# Patient Record
Sex: Female | Born: 1965 | Race: White | Hispanic: No | Marital: Married | Smoking: Never smoker
Health system: Southern US, Community
[De-identification: ages and names within clinical notes are randomized; demographics above are authoritative.]

## PROBLEM LIST (undated history)

## (undated) DIAGNOSIS — E119 Type 2 diabetes mellitus without complications: Secondary | ICD-10-CM

## (undated) DIAGNOSIS — I10 Essential (primary) hypertension: Secondary | ICD-10-CM

## (undated) DIAGNOSIS — I82412 Acute embolism and thrombosis of left femoral vein: Secondary | ICD-10-CM

## (undated) DIAGNOSIS — M8580 Other specified disorders of bone density and structure, unspecified site: Secondary | ICD-10-CM

## (undated) DIAGNOSIS — K769 Liver disease, unspecified: Secondary | ICD-10-CM

## (undated) HISTORY — DX: Liver disease, unspecified: K76.9

## (undated) HISTORY — DX: Acute embolism and thrombosis of left femoral vein: I82.412

## (undated) HISTORY — DX: Essential (primary) hypertension: I10

## (undated) HISTORY — DX: Other specified disorders of bone density and structure, unspecified site: M85.80

---

## 2002-12-04 DIAGNOSIS — I82412 Acute embolism and thrombosis of left femoral vein: Secondary | ICD-10-CM

## 2002-12-04 HISTORY — DX: Acute embolism and thrombosis of left femoral vein: I82.412

## 2002-12-04 HISTORY — PX: FOOT SURGERY: SHX648

## 2003-12-05 HISTORY — PX: PUBOVAGINAL SLING: SHX1035

## 2005-03-30 ENCOUNTER — Ambulatory Visit: Payer: Self-pay | Admitting: Obstetrics and Gynecology

## 2006-04-02 ENCOUNTER — Ambulatory Visit: Payer: Self-pay | Admitting: Obstetrics and Gynecology

## 2006-04-06 ENCOUNTER — Ambulatory Visit: Payer: Self-pay | Admitting: Obstetrics and Gynecology

## 2007-04-04 ENCOUNTER — Ambulatory Visit: Payer: Self-pay | Admitting: Obstetrics and Gynecology

## 2007-07-30 ENCOUNTER — Ambulatory Visit: Payer: Self-pay | Admitting: Internal Medicine

## 2007-08-17 ENCOUNTER — Ambulatory Visit: Payer: Self-pay | Admitting: Internal Medicine

## 2008-04-09 ENCOUNTER — Ambulatory Visit: Payer: Self-pay | Admitting: Obstetrics and Gynecology

## 2008-12-04 DIAGNOSIS — K769 Liver disease, unspecified: Secondary | ICD-10-CM

## 2008-12-04 HISTORY — DX: Liver disease, unspecified: K76.9

## 2008-12-04 HISTORY — PX: COLONOSCOPY: SHX174

## 2009-04-12 ENCOUNTER — Ambulatory Visit: Payer: Self-pay | Admitting: Unknown Physician Specialty

## 2010-04-13 ENCOUNTER — Ambulatory Visit: Payer: Self-pay | Admitting: Unknown Physician Specialty

## 2011-04-17 ENCOUNTER — Ambulatory Visit: Payer: Self-pay | Admitting: Internal Medicine

## 2012-02-20 ENCOUNTER — Other Ambulatory Visit: Payer: Self-pay | Admitting: *Deleted

## 2012-02-20 MED ORDER — LOSARTAN POTASSIUM 50 MG PO TABS: 50.0000 mg | ORAL_TABLET | Freq: Every day | ORAL | Status: DC

## 2012-02-20 MED ORDER — METOPROLOL SUCCINATE ER 100 MG PO TB24: 100.0000 mg | ORAL_TABLET | Freq: Every day | ORAL | Status: DC

## 2012-02-22 MED ORDER — LOSARTAN POTASSIUM 50 MG PO TABS: 50.0000 mg | ORAL_TABLET | Freq: Every day | ORAL | Status: DC

## 2012-02-22 MED ORDER — METOPROLOL SUCCINATE ER 100 MG PO TB24: 100.0000 mg | ORAL_TABLET | Freq: Every day | ORAL | Status: DC

## 2012-02-22 NOTE — Progress Notes (Signed)
Addended by: Cristy Hilts F on: 02/22/2012 02:15 PM   Modules accepted: Orders

## 2012-03-05 ENCOUNTER — Other Ambulatory Visit: Payer: Self-pay | Admitting: *Deleted

## 2012-03-05 MED ORDER — LOSARTAN POTASSIUM 50 MG PO TABS: 50.0000 mg | ORAL_TABLET | Freq: Every day | ORAL | Status: DC

## 2012-03-05 MED ORDER — METOPROLOL SUCCINATE ER 100 MG PO TB24: 100.0000 mg | ORAL_TABLET | Freq: Every day | ORAL | Status: DC

## 2012-04-04 ENCOUNTER — Encounter: Payer: Self-pay | Admitting: Internal Medicine

## 2012-04-17 ENCOUNTER — Ambulatory Visit (INDEPENDENT_AMBULATORY_CARE_PROVIDER_SITE_OTHER): Payer: BC Managed Care – PPO | Admitting: Internal Medicine

## 2012-04-17 ENCOUNTER — Ambulatory Visit: Payer: Self-pay

## 2012-04-17 ENCOUNTER — Encounter: Payer: Self-pay | Admitting: Internal Medicine

## 2012-04-17 ENCOUNTER — Telehealth: Payer: Self-pay | Admitting: *Deleted

## 2012-04-17 VITALS — BP 145/81 | HR 54 | Temp 98.1°F | Resp 16 | Ht 63.75 in | Wt 196.0 lb

## 2012-04-17 DIAGNOSIS — B379 Candidiasis, unspecified: Secondary | ICD-10-CM | POA: Insufficient documentation

## 2012-04-17 DIAGNOSIS — E875 Hyperkalemia: Secondary | ICD-10-CM

## 2012-04-17 DIAGNOSIS — R945 Abnormal results of liver function studies: Secondary | ICD-10-CM

## 2012-04-17 DIAGNOSIS — Z0000 Encounter for general adult medical examination without abnormal findings: Secondary | ICD-10-CM | POA: Insufficient documentation

## 2012-04-17 DIAGNOSIS — E669 Obesity, unspecified: Secondary | ICD-10-CM | POA: Insufficient documentation

## 2012-04-17 DIAGNOSIS — I10 Essential (primary) hypertension: Secondary | ICD-10-CM | POA: Insufficient documentation

## 2012-04-17 DIAGNOSIS — E663 Overweight: Secondary | ICD-10-CM | POA: Insufficient documentation

## 2012-04-17 LAB — CBC WITH DIFFERENTIAL/PLATELET
Basophils Relative: 0.7 % (ref 0.0–3.0)
Eosinophils Relative: 1.1 % (ref 0.0–5.0)
HCT: 42 % (ref 36.0–46.0)
Hemoglobin: 14.2 g/dL (ref 12.0–15.0)
Lymphs Abs: 3.1 10*3/uL (ref 0.7–4.0)
MCV: 92.8 fl (ref 78.0–100.0)
Monocytes Absolute: 0.5 10*3/uL (ref 0.1–1.0)
Monocytes Relative: 7.1 % (ref 3.0–12.0)
Neutro Abs: 3.5 10*3/uL (ref 1.4–7.7)
WBC: 7.3 10*3/uL (ref 4.5–10.5)

## 2012-04-17 LAB — COMPREHENSIVE METABOLIC PANEL
ALT: 46 U/L — ABNORMAL HIGH (ref 0–35)
BUN: 11 mg/dL (ref 6–23)
CO2: 27 mEq/L (ref 19–32)
Calcium: 9.5 mg/dL (ref 8.4–10.5)
Chloride: 105 mEq/L (ref 96–112)
Creatinine, Ser: 0.6 mg/dL (ref 0.4–1.2)
GFR: 107.5 mL/min (ref >60.00)

## 2012-04-17 LAB — LIPID PANEL
HDL: 51 mg/dL (ref >39.00)
Triglycerides: 110 mg/dL (ref 0.0–149.0)

## 2012-04-17 MED ORDER — NYSTATIN-TRIAMCINOLONE 100000-0.1 UNIT/GM-% EX OINT: TOPICAL_OINTMENT | Freq: Two times a day (BID) | CUTANEOUS | Status: AC

## 2012-04-17 MED ORDER — METOPROLOL SUCCINATE ER 100 MG PO TB24: 100.0000 mg | ORAL_TABLET | Freq: Every day | ORAL | Status: DC

## 2012-04-17 MED ORDER — LOSARTAN POTASSIUM 50 MG PO TABS: 50.0000 mg | ORAL_TABLET | Freq: Every day | ORAL | Status: DC

## 2012-04-17 NOTE — Telephone Encounter (Signed)
Message copied by Regis Bill on Wed Apr 17, 2012  4:43 PM ------      Message from: Ronna Polio A      Created: Wed Apr 17, 2012  2:38 PM       Labs show that potassium is slightly high. I would like to repeat BMP tomorrow. Labs also show that LFTs are slightly high. We should repeat this in 1 month.

## 2012-04-17 NOTE — Telephone Encounter (Signed)
Lab orders entered in EMR; patient informed/SLS

## 2012-04-17 NOTE — Patient Instructions (Signed)
[email or web address]

## 2012-04-17 NOTE — Assessment & Plan Note (Signed)
BP slightly elevated today and has been per pt at home. Will check renal function with labs. Will continue current meds and recheck in 1 month. Consider increase in Losartan 100mg  if BP consistently elevated.

## 2012-04-17 NOTE — Assessment & Plan Note (Signed)
BMI 33. Discussed caloric restriction and calorie counting with a food diary. Discussed increasing physical activity with a goal of 30-60 minutes most days of the week. Recommended that she refrain from use of the pollen given the potential risk of liver toxicity. We discussed potentially using appetite suppressants in the future when available (Belviq).

## 2012-04-17 NOTE — Progress Notes (Signed)
Subjective:    Patient ID: Kathryn Massey, female    DOB: 29-Jun-1966, 46 y.o.   MRN: [id number]  HPI 46 year old female with history of hypertension presents for followup. She reports that she is generally been feeling well. She reports full compliance with her medication. She denies any headache, chest pain, palpitations.  She is concerned today about her weight. She reports that she is trying to limit caloric intake and increase her physical activity. She questions whether she might benefit from a supplement such as B. pollen.  Outpatient Encounter Prescriptions as of 04/17/2012  Medication Sig Dispense Refill  . losartan (COZAAR) 50 MG tablet Take 1 tablet (50 mg total) by mouth daily.  90 tablet  3  . metoprolol succinate (TOPROL-XL) 100 MG 24 hr tablet Take 1 tablet (100 mg total) by mouth daily. Take with or immediately following a meal.  90 tablet  3  . DISCONTD: losartan (COZAAR) 50 MG tablet Take 1 tablet (50 mg total) by mouth daily.  90 tablet  0  . DISCONTD: metoprolol succinate (TOPROL-XL) 100 MG 24 hr tablet Take 1 tablet (100 mg total) by mouth daily. Take with or immediately following a meal.  90 tablet  0  . nystatin-triamcinolone ointment (MYCOLOG) Apply topically 2 (two) times daily.  60 g  3    Review of Systems  Constitutional: Negative for fever, chills, appetite change, fatigue and unexpected weight change.  HENT: Negative for ear pain, congestion, sore throat, trouble swallowing, neck pain, voice change and sinus pressure.   Eyes: Negative for visual disturbance.  Respiratory: Negative for cough, shortness of breath, wheezing and stridor.   Cardiovascular: Negative for chest pain, palpitations and leg swelling.  Gastrointestinal: Negative for nausea, vomiting, abdominal pain, diarrhea, constipation, blood in stool, abdominal distention and anal bleeding.  Genitourinary: Negative for dysuria and flank pain.  Musculoskeletal: Negative for myalgias, arthralgias and  gait problem.  Skin: Negative for color change and rash.  Neurological: Negative for dizziness and headaches.  Hematological: Negative for adenopathy. Does not bruise/bleed easily.  Psychiatric/Behavioral: Negative for suicidal ideas, sleep disturbance and dysphoric mood. The patient is not nervous/anxious.    BP 145/81  Pulse 54  Temp(Src) 98.1 F (36.7 C) (Oral)  Resp 16  Ht 5' 3.75" (1.619 m)  Wt 196 lb (88.905 kg)  BMI 33.91 kg/m2  SpO2 99%     Objective:   Physical Exam  Constitutional: She is oriented to person, place, and time. She appears well-developed and well-nourished. No distress.  HENT:  Head: Normocephalic and atraumatic.  Right Ear: External ear normal.  Left Ear: External ear normal.  Nose: Nose normal.  Mouth/Throat: Oropharynx is clear and moist. No oropharyngeal exudate.  Eyes: Conjunctivae are normal. Pupils are equal, round, and reactive to light. Right eye exhibits no discharge. Left eye exhibits no discharge. No scleral icterus.  Neck: Normal range of motion. Neck supple. No tracheal deviation present. No thyromegaly present.  Cardiovascular: Normal rate, regular rhythm, normal heart sounds and intact distal pulses.  Exam reveals no gallop and no friction rub.   No murmur heard. Pulmonary/Chest: Effort normal and breath sounds normal. No respiratory distress. She has no wheezes. She has no rales. She exhibits no tenderness.  Abdominal: Soft. Bowel sounds are normal. She exhibits no distension and no mass. There is no tenderness. There is no guarding.  Musculoskeletal: Normal range of motion. She exhibits no edema and no tenderness.  Lymphadenopathy:    She has no cervical adenopathy.  Neurological:  She is alert and oriented to person, place, and time. No cranial nerve deficit. She exhibits normal muscle tone. Coordination normal.  Skin: Skin is warm and dry. No rash noted. She is not diaphoretic. No erythema. No pallor.  Psychiatric: She has a normal mood  and affect. Her behavior is normal. Judgment and thought content normal.          Assessment & Plan:

## 2012-04-18 ENCOUNTER — Other Ambulatory Visit (INDEPENDENT_AMBULATORY_CARE_PROVIDER_SITE_OTHER): Payer: BC Managed Care – PPO | Admitting: *Deleted

## 2012-04-18 DIAGNOSIS — I10 Essential (primary) hypertension: Secondary | ICD-10-CM

## 2012-04-19 LAB — BASIC METABOLIC PANEL
BUN: 12 mg/dL (ref 6–23)
CO2: 27 mEq/L (ref 19–32)
Chloride: 105 mEq/L (ref 96–112)
Glucose, Bld: 105 mg/dL — ABNORMAL HIGH (ref 70–99)
Potassium: 5 mEq/L (ref 3.5–5.1)

## 2012-05-10 ENCOUNTER — Encounter: Payer: Self-pay | Admitting: Internal Medicine

## 2012-05-21 ENCOUNTER — Ambulatory Visit (INDEPENDENT_AMBULATORY_CARE_PROVIDER_SITE_OTHER): Payer: BC Managed Care – PPO | Admitting: Internal Medicine

## 2012-05-21 ENCOUNTER — Encounter: Payer: Self-pay | Admitting: Internal Medicine

## 2012-05-21 VITALS — BP 142/86 | HR 64 | Temp 98.7°F | Ht 63.75 in | Wt 195.5 lb

## 2012-05-21 DIAGNOSIS — E669 Obesity, unspecified: Secondary | ICD-10-CM

## 2012-05-21 DIAGNOSIS — R7989 Other specified abnormal findings of blood chemistry: Secondary | ICD-10-CM

## 2012-05-21 DIAGNOSIS — I10 Essential (primary) hypertension: Secondary | ICD-10-CM

## 2012-05-21 NOTE — Assessment & Plan Note (Signed)
BP improved at home, slightly elevated today. Will continue current medications for now. She will check blood pressure one to 2 times per week and e-mail with readings. If blood pressure consistently greater than 140/90, will increase dose of losartan to 100 mg daily. Followup in 6 months.

## 2012-05-21 NOTE — Progress Notes (Signed)
  Subjective:    Patient ID: Kathryn Massey, female    DOB: 03/08/1966, 46 y.o.   MRN: [id number]  HPI 46 year old female with history of hypertension and obesity presents for followup. She reports she is doing well. She notes she is trying to increase her exercise and was out walking even this morning. She reports full compliance with her medications. She notes that her blood pressure is typically been near 130/60-70. She denies any headache, palpitations, chest pain.  Outpatient Encounter Prescriptions as of 05/21/2012  Medication Sig Dispense Refill  . losartan (COZAAR) 50 MG tablet Take 1 tablet (50 mg total) by mouth daily.  90 tablet  3  . metoprolol succinate (TOPROL-XL) 100 MG 24 hr tablet Take 1 tablet (100 mg total) by mouth daily. Take with or immediately following a meal.  90 tablet  3  . nystatin-triamcinolone ointment (MYCOLOG) Apply topically 2 (two) times daily.  60 g  3    Review of Systems  Constitutional: Negative for fever, chills, appetite change, fatigue and unexpected weight change.  HENT: Negative for ear pain, congestion, sore throat, trouble swallowing, neck pain, voice change and sinus pressure.   Eyes: Negative for visual disturbance.  Respiratory: Negative for cough, shortness of breath, wheezing and stridor.   Cardiovascular: Negative for chest pain, palpitations and leg swelling.  Gastrointestinal: Negative for nausea, vomiting, abdominal pain, diarrhea, constipation, blood in stool, abdominal distention and anal bleeding.  Genitourinary: Negative for dysuria and flank pain.  Musculoskeletal: Negative for myalgias, arthralgias and gait problem.  Skin: Negative for color change and rash.  Neurological: Negative for dizziness and headaches.  Hematological: Negative for adenopathy. Does not bruise/bleed easily.  Psychiatric/Behavioral: Negative for suicidal ideas, disturbed wake/sleep cycle and dysphoric mood. The patient is not nervous/anxious.    BP 142/86   Pulse 64  Temp 98.7 F (37.1 C) (Oral)  Ht 5' 3.75" (1.619 m)  Wt 195 lb 8 oz (88.678 kg)  BMI 33.82 kg/m2  SpO2 98%     Objective:   Physical Exam  Constitutional: She is oriented to person, place, and time. She appears well-developed and well-nourished. No distress.  HENT:  Head: Normocephalic and atraumatic.  Right Ear: External ear normal.  Left Ear: External ear normal.  Nose: Nose normal.  Mouth/Throat: Oropharynx is clear and moist. No oropharyngeal exudate.  Eyes: Conjunctivae are normal. Pupils are equal, round, and reactive to light. Right eye exhibits no discharge. Left eye exhibits no discharge. No scleral icterus.  Neck: Normal range of motion. Neck supple. No tracheal deviation present. No thyromegaly present.  Cardiovascular: Normal rate, regular rhythm, normal heart sounds and intact distal pulses.  Exam reveals no gallop and no friction rub.   No murmur heard. Pulmonary/Chest: Effort normal and breath sounds normal. No respiratory distress. She has no wheezes. She has no rales. She exhibits no tenderness.  Musculoskeletal: Normal range of motion. She exhibits no edema and no tenderness.  Lymphadenopathy:    She has no cervical adenopathy.  Neurological: She is alert and oriented to person, place, and time. No cranial nerve deficit. She exhibits normal muscle tone. Coordination normal.  Skin: Skin is warm and dry. No rash noted. She is not diaphoretic. No erythema. No pallor.  Psychiatric: She has a normal mood and affect. Her behavior is normal. Judgment and thought content normal.          Assessment & Plan:

## 2012-05-21 NOTE — Assessment & Plan Note (Signed)
Mild elevation of LFTs noted in May 2013. We'll plan to repeat within the next 30 days.

## 2012-05-21 NOTE — Assessment & Plan Note (Signed)
Encouraged pt to continue efforts at healthy diet and exercise. Follow up 6 months.

## 2012-05-21 NOTE — Patient Instructions (Signed)
Check blood pressure 1-2 times per week. Call or email if blood pressure >140/90 consistently.

## 2012-05-22 ENCOUNTER — Telehealth: Payer: Self-pay | Admitting: *Deleted

## 2012-05-22 NOTE — Telephone Encounter (Signed)
Patient notified and lab appt scheduled.  

## 2012-05-22 NOTE — Telephone Encounter (Signed)
Message copied by Jobie Quaker on Wed May 22, 2012 11:46 AM ------      Message from: Ronna Polio A      Created: Tue May 21, 2012  9:41 AM      Regarding: Repeat LFTs       This pt will need to have repeat CMP prior to her next visit. I forgot to tell her this today. Just to repeat LFTs

## 2012-06-11 ENCOUNTER — Other Ambulatory Visit: Payer: Self-pay | Admitting: *Deleted

## 2012-06-11 DIAGNOSIS — I10 Essential (primary) hypertension: Secondary | ICD-10-CM

## 2012-06-11 MED ORDER — LOSARTAN POTASSIUM 50 MG PO TABS: 50.0000 mg | ORAL_TABLET | Freq: Every day | ORAL | Status: DC

## 2012-06-11 MED ORDER — METOPROLOL SUCCINATE ER 100 MG PO TB24: 100.0000 mg | ORAL_TABLET | Freq: Every day | ORAL | Status: DC

## 2012-07-09 LAB — HM DIABETES EYE EXAM: HM Diabetic Eye Exam: NORMAL

## 2012-08-04 LAB — HM DIABETES EYE EXAM: HM Diabetic Eye Exam: NORMAL

## 2012-08-29 ENCOUNTER — Ambulatory Visit: Payer: Self-pay

## 2012-11-18 ENCOUNTER — Other Ambulatory Visit (INDEPENDENT_AMBULATORY_CARE_PROVIDER_SITE_OTHER): Payer: BC Managed Care – PPO

## 2012-11-18 DIAGNOSIS — E875 Hyperkalemia: Secondary | ICD-10-CM

## 2012-11-18 DIAGNOSIS — R945 Abnormal results of liver function studies: Secondary | ICD-10-CM

## 2012-11-18 LAB — BASIC METABOLIC PANEL
BUN: 15 mg/dL (ref 6–23)
CO2: 28 mEq/L (ref 19–32)
Calcium: 9.4 mg/dL (ref 8.4–10.5)
Chloride: 104 mEq/L (ref 96–112)
Creatinine, Ser: 0.6 mg/dL (ref 0.4–1.2)
Glucose, Bld: 157 mg/dL — ABNORMAL HIGH (ref 70–99)

## 2012-11-18 LAB — HEPATIC FUNCTION PANEL
ALT: 39 U/L — ABNORMAL HIGH (ref 0–35)
Albumin: 4.2 g/dL (ref 3.5–5.2)
Bilirubin, Direct: 0.1 mg/dL (ref 0.0–0.3)
Total Protein: 7.3 g/dL (ref 6.0–8.3)

## 2012-11-21 ENCOUNTER — Encounter: Payer: Self-pay | Admitting: Internal Medicine

## 2012-11-21 ENCOUNTER — Telehealth: Payer: Self-pay | Admitting: Internal Medicine

## 2012-11-21 ENCOUNTER — Ambulatory Visit (INDEPENDENT_AMBULATORY_CARE_PROVIDER_SITE_OTHER): Payer: BC Managed Care – PPO | Admitting: Internal Medicine

## 2012-11-21 VITALS — BP 132/82 | HR 65 | Temp 98.0°F | Resp 16 | Wt 192.2 lb

## 2012-11-21 DIAGNOSIS — I10 Essential (primary) hypertension: Secondary | ICD-10-CM

## 2012-11-21 DIAGNOSIS — R7309 Other abnormal glucose: Secondary | ICD-10-CM

## 2012-11-21 DIAGNOSIS — R739 Hyperglycemia, unspecified: Secondary | ICD-10-CM

## 2012-11-21 DIAGNOSIS — B373 Candidiasis of vulva and vagina: Secondary | ICD-10-CM

## 2012-11-21 DIAGNOSIS — E669 Obesity, unspecified: Secondary | ICD-10-CM

## 2012-11-21 DIAGNOSIS — Z1331 Encounter for screening for depression: Secondary | ICD-10-CM

## 2012-11-21 MED ORDER — ONETOUCH ULTRA SYSTEM W/DEVICE KIT: 1.0000 | PACK | Freq: Once | Status: AC

## 2012-11-21 MED ORDER — METFORMIN HCL 500 MG PO TABS: 500.0000 mg | ORAL_TABLET | Freq: Two times a day (BID) | ORAL | Status: DC

## 2012-11-21 MED ORDER — FLUCONAZOLE 150 MG PO TABS: 150.0000 mg | ORAL_TABLET | Freq: Every day | ORAL | Status: DC

## 2012-11-21 MED ORDER — GLUCOSE BLOOD VI STRP: ORAL_STRIP | Status: DC

## 2012-11-21 NOTE — Assessment & Plan Note (Signed)
Patient was noted to have fasting blood sugar of 157 on recent labs. Will send A1c with labs today. She has a strong family history of diabetes. We discussed that fasting blood sugar greater than 125 is consistent with diabetes. We discussed that will likely plan to start metformin after reviewing results of A1c reading today. Encouraged patient to follow a low glycemic index diet and increase physical activity with a goal of 40 minutes 3 times per week at a minimum.

## 2012-11-21 NOTE — Assessment & Plan Note (Signed)
BMI 33. Encouraged patient to follow a Mediterranean style diet, low in process sugars in saturated fat. Encouraged her to increase physical activity with goal of 40 minutes 3 days per week at a minimum. She is planning to join a local gym. Followup in 3 months or sooner as needed.

## 2012-11-21 NOTE — Telephone Encounter (Signed)
A1c elevated 8.1%

## 2012-11-21 NOTE — Assessment & Plan Note (Signed)
Symptoms are consistent with vaginal candidiasis. Will treat with fluconazole. Patient will call or return to clinic if symptoms are persistent.

## 2012-11-21 NOTE — Progress Notes (Signed)
Subjective:    Patient ID: Kathryn Massey, female    DOB: [birth year], 46 y.o.   MRN: [id number]  HPI 46 year old female with history of obesity, hypertension, elevated liver function test presents for followup. Prior to this visit, she had lab work performed which showed improvement of liver function tests. It also showed elevated blood sugar at 157. Patient reports she was fasting during exam. She is a strong family history of diabetes in her mother and siblings. She has never been diagnosed with diabetes.  In regards to hypertension, she reports full compliance with her medications. She denies any chest pain, palpitations, headache. In regards to her obesity, she reports she is planning to join a local gym over the next few weeks. She is trying to limit overall caloric intake.  Outpatient Encounter Prescriptions as of 11/21/2012  Medication Sig Dispense Refill  . losartan (COZAAR) 50 MG tablet Take 1 tablet (50 mg total) by mouth daily.  90 tablet  3  . metoprolol succinate (TOPROL-XL) 100 MG 24 hr tablet Take 1 tablet (100 mg total) by mouth daily. Take with or immediately following a meal.  90 tablet  3  . nystatin-triamcinolone ointment (MYCOLOG) Apply topically 2 (two) times daily.  60 g  3  . fluconazole (DIFLUCAN) 150 MG tablet Take 1 tablet (150 mg total) by mouth daily.  3 tablet  0   BP 132/82  Pulse 65  Temp 98 F (36.7 C) (Oral)  Resp 16  Wt 192 lb 4 oz (87.204 kg)  SpO2 98%  Review of Systems  Constitutional: Negative for fever, chills, appetite change, fatigue and unexpected weight change.  HENT: Negative for ear pain, congestion, sore throat, trouble swallowing, neck pain, voice change and sinus pressure.   Eyes: Negative for visual disturbance.  Respiratory: Negative for cough, shortness of breath, wheezing and stridor.   Cardiovascular: Negative for chest pain, palpitations and leg swelling.  Gastrointestinal: Negative for nausea, vomiting, abdominal pain, diarrhea,  constipation, blood in stool, abdominal distention and anal bleeding.  Genitourinary: Negative for dysuria and flank pain.  Musculoskeletal: Negative for myalgias, arthralgias and gait problem.  Skin: Negative for color change and rash.  Neurological: Negative for dizziness and headaches.  Hematological: Negative for adenopathy. Does not bruise/bleed easily.  Psychiatric/Behavioral: Negative for suicidal ideas, sleep disturbance and dysphoric mood. The patient is not nervous/anxious.        Objective:   Physical Exam  Constitutional: She is oriented to person, place, and time. She appears well-developed and well-nourished. No distress.  HENT:  Head: Normocephalic and atraumatic.  Right Ear: External ear normal.  Left Ear: External ear normal.  Nose: Nose normal.  Mouth/Throat: Oropharynx is clear and moist. No oropharyngeal exudate.  Eyes: Conjunctivae normal are normal. Pupils are equal, round, and reactive to light. Right eye exhibits no discharge. Left eye exhibits no discharge. No scleral icterus.  Neck: Normal range of motion. Neck supple. No tracheal deviation present. No thyromegaly present.  Cardiovascular: Normal rate, regular rhythm, normal heart sounds and intact distal pulses.  Exam reveals no gallop and no friction rub.   No murmur heard. Pulmonary/Chest: Effort normal and breath sounds normal. No respiratory distress. She has no wheezes. She has no rales. She exhibits no tenderness.  Musculoskeletal: Normal range of motion. She exhibits no edema and no tenderness.  Lymphadenopathy:    She has no cervical adenopathy.  Neurological: She is alert and oriented to person, place, and time. No cranial nerve deficit. She exhibits normal muscle  tone. Coordination normal.  Skin: Skin is warm and dry. No rash noted. She is not diaphoretic. No erythema. No pallor.  Psychiatric: She has a normal mood and affect. Her behavior is normal. Judgment and thought content normal.           Assessment & Plan:

## 2012-11-21 NOTE — Assessment & Plan Note (Signed)
Blood pressure is well-controlled on current medications. Will continue. Recent renal function checked earlier this week was normal. Followup in 3 months.

## 2012-11-22 ENCOUNTER — Encounter: Payer: Self-pay | Admitting: Internal Medicine

## 2012-11-25 ENCOUNTER — Encounter: Payer: Self-pay | Admitting: Internal Medicine

## 2012-12-13 ENCOUNTER — Encounter: Payer: Self-pay | Admitting: Internal Medicine

## 2012-12-13 ENCOUNTER — Ambulatory Visit (INDEPENDENT_AMBULATORY_CARE_PROVIDER_SITE_OTHER): Payer: BC Managed Care – PPO | Admitting: Internal Medicine

## 2012-12-13 VITALS — BP 110/74 | HR 72 | Temp 98.2°F | Ht 63.75 in | Wt 187.5 lb

## 2012-12-13 DIAGNOSIS — J0100 Acute maxillary sinusitis, unspecified: Secondary | ICD-10-CM

## 2012-12-13 DIAGNOSIS — R739 Hyperglycemia, unspecified: Secondary | ICD-10-CM

## 2012-12-13 DIAGNOSIS — E669 Obesity, unspecified: Secondary | ICD-10-CM

## 2012-12-13 DIAGNOSIS — R7309 Other abnormal glucose: Secondary | ICD-10-CM

## 2012-12-13 DIAGNOSIS — E119 Type 2 diabetes mellitus without complications: Secondary | ICD-10-CM | POA: Insufficient documentation

## 2012-12-13 MED ORDER — AMOXICILLIN-POT CLAVULANATE 875-125 MG PO TABS: 1.0000 | ORAL_TABLET | Freq: Two times a day (BID) | ORAL | Status: DC

## 2012-12-13 NOTE — Progress Notes (Signed)
Subjective:    Patient ID: Kathryn Massey, female    DOB: 08/11/1966, 47 y.o.   MRN: [id number]  HPI 47 year old female with recent diagnosis of diabetes and history of hypertension presents for acute visit complaining of one-week history of sinus congestion, purulent nasal drainage, bilateral facial pain and eye pain. She has been taking over-the-counter medications including Mucinex antihistamines with no improvement. She denies fever or chills. She has had occasional cough. She reports mild ear pain. She denies shortness of breath.  In regards to recent diagnosis of diabetes, she brings record of blood sugars which show most sugars between 100-150. She has made huge changes in her diet, limiting process sugars and has also been increasing her physical activity. She has lost 5 pounds.  Outpatient Encounter Prescriptions as of 12/13/2012  Medication Sig Dispense Refill  . Blood Glucose Monitoring Suppl (ONE TOUCH ULTRA SYSTEM KIT) W/DEVICE KIT 1 kit by Does not apply route once.  1 each  0  . glucose blood (ONE TOUCH ULTRA TEST) test strip Use as instructed  100 each  12  . losartan (COZAAR) 50 MG tablet Take 1 tablet (50 mg total) by mouth daily.  90 tablet  3  . metFORMIN (GLUCOPHAGE) 500 MG tablet Take 1 tablet (500 mg total) by mouth 2 (two) times daily with a meal.  180 tablet  3  . metoprolol succinate (TOPROL-XL) 100 MG 24 hr tablet Take 1 tablet (100 mg total) by mouth daily. Take with or immediately following a meal.  90 tablet  3  . nystatin-triamcinolone ointment (MYCOLOG) Apply topically 2 (two) times daily.  60 g  3   BP 110/74  Pulse 72  Temp 98.2 F (36.8 C) (Oral)  Ht 5' 3.75" (1.619 m)  Wt 187 lb 8 oz (85.049 kg)  BMI 32.44 kg/m2  SpO2 98%  Review of Systems  Constitutional: Positive for fatigue. Negative for fever, chills, appetite change and unexpected weight change.  HENT: Positive for congestion, rhinorrhea, voice change, postnasal drip and sinus pressure. Negative  for ear pain, sore throat, trouble swallowing and neck pain.   Eyes: Positive for pain. Negative for visual disturbance.  Respiratory: Positive for cough. Negative for shortness of breath, wheezing and stridor.   Cardiovascular: Negative for chest pain, palpitations and leg swelling.  Gastrointestinal: Negative for nausea, vomiting, abdominal pain, diarrhea, constipation, blood in stool, abdominal distention and anal bleeding.  Genitourinary: Negative for dysuria and flank pain.  Musculoskeletal: Negative for myalgias, arthralgias and gait problem.  Skin: Negative for color change and rash.  Neurological: Negative for dizziness and headaches.  Hematological: Negative for adenopathy. Does not bruise/bleed easily.  Psychiatric/Behavioral: Negative for suicidal ideas, sleep disturbance and dysphoric mood. The patient is not nervous/anxious.        Objective:   Physical Exam  Constitutional: She is oriented to person, place, and time. She appears well-developed and well-nourished. No distress.  HENT:  Head: Normocephalic and atraumatic.  Right Ear: External ear normal. A middle ear effusion is present.  Left Ear: External ear normal. A middle ear effusion is present.  Nose: Mucosal edema present. Right sinus exhibits maxillary sinus tenderness. Left sinus exhibits maxillary sinus tenderness.  Mouth/Throat: Oropharynx is clear and moist. No oropharyngeal exudate.  Eyes: Conjunctivae normal are normal. Pupils are equal, round, and reactive to light. Right eye exhibits no discharge. Left eye exhibits no discharge. No scleral icterus.  Neck: Normal range of motion. Neck supple. No tracheal deviation present. No thyromegaly present.  Cardiovascular:  Normal rate, regular rhythm, normal heart sounds and intact distal pulses.  Exam reveals no gallop and no friction rub.   No murmur heard. Pulmonary/Chest: Effort normal and breath sounds normal. No respiratory distress. She has no wheezes. She has no  rales. She exhibits no tenderness.  Musculoskeletal: Normal range of motion. She exhibits no edema and no tenderness.  Lymphadenopathy:    She has no cervical adenopathy.  Neurological: She is alert and oriented to person, place, and time. No cranial nerve deficit. She exhibits normal muscle tone. Coordination normal.  Skin: Skin is warm and dry. No rash noted. She is not diaphoretic. No erythema. No pallor.  Psychiatric: She has a normal mood and affect. Her behavior is normal. Judgment and thought content normal.          Assessment & Plan:

## 2012-12-13 NOTE — Assessment & Plan Note (Signed)
Symptoms and exam consistent with bilateral acute maxillary sinusitis. Given persistence of symptoms >1 week, and lack of improvement with Mucinex, antihistamines, will treat with Augmentin. Pt will call if symptoms not improving.

## 2012-12-13 NOTE — Assessment & Plan Note (Signed)
Congratulated pt on weight loss of 5lb since last visit. Encouraged pt to continue efforts at healthy diet and regular physical activity, with goal of 3x per week.

## 2012-12-13 NOTE — Assessment & Plan Note (Signed)
BG well controlled based on pt report. Will continue metformin. Repeat A1c in 3.2014.  Encouraged pt to continue effort at healthy diet and regular physical activity.

## 2012-12-23 ENCOUNTER — Ambulatory Visit: Payer: BC Managed Care – PPO | Admitting: Internal Medicine

## 2013-02-17 ENCOUNTER — Other Ambulatory Visit (INDEPENDENT_AMBULATORY_CARE_PROVIDER_SITE_OTHER): Payer: BC Managed Care – PPO

## 2013-02-17 LAB — COMPREHENSIVE METABOLIC PANEL
ALT: 40 U/L — ABNORMAL HIGH (ref 0–35)
AST: 30 U/L (ref 0–37)
BUN: 11 mg/dL (ref 6–23)
Calcium: 9 mg/dL (ref 8.4–10.5)
Chloride: 105 mEq/L (ref 96–112)
Creatinine, Ser: 0.6 mg/dL (ref 0.4–1.2)
GFR: 105.19 mL/min (ref >60.00)
Total Bilirubin: 0.5 mg/dL (ref 0.3–1.2)

## 2013-02-24 ENCOUNTER — Ambulatory Visit (INDEPENDENT_AMBULATORY_CARE_PROVIDER_SITE_OTHER): Payer: BC Managed Care – PPO | Admitting: Internal Medicine

## 2013-02-24 ENCOUNTER — Encounter: Payer: Self-pay | Admitting: Internal Medicine

## 2013-02-24 VITALS — BP 152/88 | HR 64 | Temp 98.3°F | Wt 179.0 lb

## 2013-02-24 DIAGNOSIS — I10 Essential (primary) hypertension: Secondary | ICD-10-CM

## 2013-02-24 DIAGNOSIS — E119 Type 2 diabetes mellitus without complications: Secondary | ICD-10-CM

## 2013-02-24 DIAGNOSIS — E669 Obesity, unspecified: Secondary | ICD-10-CM

## 2013-02-24 NOTE — Assessment & Plan Note (Signed)
Congratulated pt on marked improvement in A1c from 8% to 6.4%. Encouraged her to continue efforts at healthy diet and regular physical activity. Follow up with repeat A1c in 3 months.

## 2013-02-24 NOTE — Assessment & Plan Note (Signed)
BP Readings from Last 3 Encounters:  02/24/13 152/88  12/13/12 110/74  11/21/12 132/82   BP slightly increased today, but has been well controlled at home. Will continue to monitor for now. Pt will call if consistently >140/90.

## 2013-02-24 NOTE — Progress Notes (Signed)
Subjective:    Patient ID: Kathryn Massey, female    DOB: Oct 05, 1966, 47 y.o.   MRN: [id number]  HPI 47YO female presents to follow up DM, obesity and HTN.  DM and obesity - Doing well, has adopted diet low in processed carbohydrates and saturated fats. Limiting caloric intake. Trying to increase physical activity. Compliant with metformin. Most BG between 80-120.  HTN - BP well controlled at home. No headaches, chest pain, palpitations. Compliant with medications.  Outpatient Encounter Prescriptions as of 02/24/2013  Medication Sig Dispense Refill  . Blood Glucose Monitoring Suppl (ONE TOUCH ULTRA SYSTEM KIT) W/DEVICE KIT 1 kit by Does not apply route once.  1 each  0  . glucose blood (ONE TOUCH ULTRA TEST) test strip Use as instructed  100 each  12  . losartan (COZAAR) 50 MG tablet Take 1 tablet (50 mg total) by mouth daily.  90 tablet  3  . metFORMIN (GLUCOPHAGE) 500 MG tablet Take 1 tablet (500 mg total) by mouth 2 (two) times daily with a meal.  180 tablet  3  . metoprolol succinate (TOPROL-XL) 100 MG 24 hr tablet Take 1 tablet (100 mg total) by mouth daily. Take with or immediately following a meal.  90 tablet  3  . nystatin-triamcinolone ointment (MYCOLOG) Apply topically 2 (two) times daily.  60 g  3  . [DISCONTINUED] amoxicillin-clavulanate (AUGMENTIN) 875-125 MG per tablet Take 1 tablet by mouth 2 (two) times daily.  20 tablet  0   No facility-administered encounter medications on file as of 02/24/2013.   BP 152/88  Pulse 64  Temp(Src) 98.3 F (36.8 C) (Oral)  Wt 179 lb (81.194 kg)  BMI 30.98 kg/m2  SpO2 96%  Review of Systems  Constitutional: Negative for fever, chills, appetite change, fatigue and unexpected weight change.  HENT: Negative for ear pain, congestion, sore throat, trouble swallowing, neck pain, voice change and sinus pressure.   Eyes: Negative for visual disturbance.  Respiratory: Negative for cough, shortness of breath, wheezing and stridor.    Cardiovascular: Negative for chest pain, palpitations and leg swelling.  Gastrointestinal: Negative for nausea, vomiting, abdominal pain, diarrhea, constipation, blood in stool, abdominal distention and anal bleeding.  Genitourinary: Negative for dysuria and flank pain.  Musculoskeletal: Negative for myalgias, arthralgias and gait problem.  Skin: Negative for color change and rash.  Neurological: Negative for dizziness and headaches.  Hematological: Negative for adenopathy. Does not bruise/bleed easily.  Psychiatric/Behavioral: Negative for suicidal ideas, sleep disturbance and dysphoric mood. The patient is not nervous/anxious.        Objective:   Physical Exam  Constitutional: She is oriented to person, place, and time. She appears well-developed and well-nourished. No distress.  HENT:  Head: Normocephalic and atraumatic.  Right Ear: External ear normal.  Left Ear: External ear normal.  Nose: Nose normal.  Mouth/Throat: Oropharynx is clear and moist. No oropharyngeal exudate.  Eyes: Conjunctivae are normal. Pupils are equal, round, and reactive to light. Right eye exhibits no discharge. Left eye exhibits no discharge. No scleral icterus.  Neck: Normal range of motion. Neck supple. No tracheal deviation present. No thyromegaly present.  Cardiovascular: Normal rate, regular rhythm, normal heart sounds and intact distal pulses.  Exam reveals no gallop and no friction rub.   No murmur heard. Pulmonary/Chest: Effort normal and breath sounds normal. No respiratory distress. She has no wheezes. She has no rales. She exhibits no tenderness.  Musculoskeletal: Normal range of motion. She exhibits no edema and no tenderness.  Lymphadenopathy:  She has no cervical adenopathy.  Neurological: She is alert and oriented to person, place, and time. No cranial nerve deficit. She exhibits normal muscle tone. Coordination normal.  Skin: Skin is warm and dry. No rash noted. She is not diaphoretic. No  erythema. No pallor.  Psychiatric: She has a normal mood and affect. Her behavior is normal. Judgment and thought content normal.          Assessment & Plan:

## 2013-02-24 NOTE — Assessment & Plan Note (Signed)
Wt Readings from Last 3 Encounters:  02/24/13 179 lb (81.194 kg)  12/13/12 187 lb 8 oz (85.049 kg)  11/21/12 192 lb 4 oz (87.204 kg)   Congratulated pt on over 10lb weight loss. Encouraged her to continue efforts at healthy diet and regular physical activity.

## 2013-05-05 ENCOUNTER — Ambulatory Visit: Payer: Self-pay

## 2013-05-23 ENCOUNTER — Other Ambulatory Visit: Payer: BC Managed Care – PPO

## 2013-05-27 ENCOUNTER — Ambulatory Visit: Payer: BC Managed Care – PPO | Admitting: Internal Medicine

## 2013-06-02 ENCOUNTER — Other Ambulatory Visit (INDEPENDENT_AMBULATORY_CARE_PROVIDER_SITE_OTHER): Payer: BC Managed Care – PPO

## 2013-06-02 DIAGNOSIS — E119 Type 2 diabetes mellitus without complications: Secondary | ICD-10-CM

## 2013-06-02 LAB — COMPREHENSIVE METABOLIC PANEL
ALT: 31 U/L (ref 0–35)
Albumin: 4.3 g/dL (ref 3.5–5.2)
Alkaline Phosphatase: 60 U/L (ref 39–117)
CO2: 24 mEq/L (ref 19–32)
Potassium: 4.8 mEq/L (ref 3.5–5.1)
Sodium: 139 mEq/L (ref 135–145)
Total Bilirubin: 0.7 mg/dL (ref 0.3–1.2)
Total Protein: 7.3 g/dL (ref 6.0–8.3)

## 2013-06-02 LAB — HEMOGLOBIN A1C: Hgb A1c MFr Bld: 6.6 % — ABNORMAL HIGH (ref 4.6–6.5)

## 2013-06-04 ENCOUNTER — Encounter: Payer: Self-pay | Admitting: Internal Medicine

## 2013-07-04 ENCOUNTER — Encounter: Payer: Self-pay | Admitting: Internal Medicine

## 2013-07-04 ENCOUNTER — Ambulatory Visit (INDEPENDENT_AMBULATORY_CARE_PROVIDER_SITE_OTHER): Payer: BC Managed Care – PPO | Admitting: Internal Medicine

## 2013-07-04 VITALS — BP 144/90 | HR 55 | Temp 98.4°F | Wt 180.0 lb

## 2013-07-04 DIAGNOSIS — E669 Obesity, unspecified: Secondary | ICD-10-CM

## 2013-07-04 DIAGNOSIS — I10 Essential (primary) hypertension: Secondary | ICD-10-CM

## 2013-07-04 DIAGNOSIS — E119 Type 2 diabetes mellitus without complications: Secondary | ICD-10-CM

## 2013-07-04 DIAGNOSIS — Z23 Encounter for immunization: Secondary | ICD-10-CM

## 2013-07-04 LAB — MICROALBUMIN / CREATININE URINE RATIO
Creatinine,U: 69.5 mg/dL
Microalb Creat Ratio: 1.2 mg/g (ref 0.0–30.0)
Microalb, Ur: 0.8 mg/dL (ref 0.0–1.9)

## 2013-07-04 NOTE — Assessment & Plan Note (Signed)
BP Readings from Last 3 Encounters:  07/04/13 144/90  02/24/13 152/88  12/13/12 110/74   Blood pressure slightly elevated today. Suspect related to increased anxiety with ongoing family issues. Previously has been well-controlled. Will continue losartan and metoprolol. Plan recheck of blood pressure at followup in September 2014.

## 2013-07-04 NOTE — Assessment & Plan Note (Signed)
Wt Readings from Last 3 Encounters:  07/04/13 180 lb (81.647 kg)  02/24/13 179 lb (81.194 kg)  12/13/12 187 lb 8 oz (85.049 kg)   Weight stable. Encouraged healthy diet and regular physical activity with goal of weight loss.

## 2013-07-04 NOTE — Progress Notes (Signed)
Subjective:    Patient ID: Kathryn Massey, female    DOB: 01/25/1966, 47 y.o.   MRN: [id number]  HPI 47 year old female with history of diabetes, hypertension, obesity presents for followup. She reports she is generally feeling well. Blood sugars have been well-controlled typically near 100 fasting. She notes some dietary indiscretion with recent summer travel. She is compliant with her medications. She notes recent increase in anxiety related to ongoing family issues. She attributes her elevated blood pressure today to this. She denies any headache, chest pain, palpitations. No other new concerns today.  Outpatient Encounter Prescriptions as of 07/04/2013  Medication Sig Dispense Refill  . Blood Glucose Monitoring Suppl (ONE TOUCH ULTRA SYSTEM KIT) W/DEVICE KIT 1 kit by Does not apply route once.  1 each  0  . glucose blood (ONE TOUCH ULTRA TEST) test strip Use as instructed  100 each  12  . losartan (COZAAR) 50 MG tablet Take 1 tablet (50 mg total) by mouth daily.  90 tablet  3  . metFORMIN (GLUCOPHAGE) 500 MG tablet Take 1 tablet (500 mg total) by mouth 2 (two) times daily with a meal.  180 tablet  3  . metoprolol succinate (TOPROL-XL) 100 MG 24 hr tablet Take 1 tablet (100 mg total) by mouth daily. Take with or immediately following a meal.  90 tablet  3   No facility-administered encounter medications on file as of 07/04/2013.   BP 144/90  Pulse 55  Temp(Src) 98.4 F (36.9 C) (Oral)  Wt 180 lb (81.647 kg)  BMI 31.15 kg/m2  SpO2 96%  Review of Systems  Constitutional: Negative for fever, chills, appetite change, fatigue and unexpected weight change.  HENT: Negative for ear pain, congestion, sore throat, trouble swallowing, neck pain, voice change and sinus pressure.   Eyes: Negative for visual disturbance.  Respiratory: Negative for cough, shortness of breath, wheezing and stridor.   Cardiovascular: Negative for chest pain, palpitations and leg swelling.  Gastrointestinal: Negative  for nausea, vomiting, abdominal pain, diarrhea, constipation, blood in stool, abdominal distention and anal bleeding.  Genitourinary: Negative for dysuria and flank pain.  Musculoskeletal: Negative for myalgias, arthralgias and gait problem.  Skin: Negative for color change and rash.  Neurological: Negative for dizziness and headaches.  Hematological: Negative for adenopathy. Does not bruise/bleed easily.  Psychiatric/Behavioral: Negative for suicidal ideas, sleep disturbance and dysphoric mood. The patient is nervous/anxious.        Objective:   Physical Exam  Constitutional: She is oriented to person, place, and time. She appears well-developed and well-nourished. No distress.  HENT:  Head: Normocephalic and atraumatic.  Right Ear: External ear normal.  Left Ear: External ear normal.  Nose: Nose normal.  Mouth/Throat: Oropharynx is clear and moist. No oropharyngeal exudate.  Eyes: Conjunctivae are normal. Pupils are equal, round, and reactive to light. Right eye exhibits no discharge. Left eye exhibits no discharge. No scleral icterus.  Neck: Normal range of motion. Neck supple. No tracheal deviation present. No thyromegaly present.  Cardiovascular: Normal rate, regular rhythm, normal heart sounds and intact distal pulses.  Exam reveals no gallop and no friction rub.   No murmur heard. Pulmonary/Chest: Effort normal and breath sounds normal. No accessory muscle usage. Not tachypneic. No respiratory distress. She has no decreased breath sounds. She has no wheezes. She has no rhonchi. She has no rales. She exhibits no tenderness.  Musculoskeletal: Normal range of motion. She exhibits no edema and no tenderness.  Lymphadenopathy:    She has no cervical adenopathy.  Neurological: She is alert and oriented to person, place, and time. No cranial nerve deficit. She exhibits normal muscle tone. Coordination normal.  Skin: Skin is warm and dry. No rash noted. She is not diaphoretic. No erythema.  No pallor.  Psychiatric: She has a normal mood and affect. Her behavior is normal. Judgment and thought content normal.          Assessment & Plan:

## 2013-07-04 NOTE — Assessment & Plan Note (Signed)
Blood sugars have been well-controlled. Will plan to repeat A1c in September 2014. Continue metformin. Encourage continued efforts at healthy diet. Foot exam normal today.

## 2013-07-08 ENCOUNTER — Encounter: Payer: Self-pay | Admitting: Internal Medicine

## 2013-07-08 ENCOUNTER — Other Ambulatory Visit: Payer: Self-pay | Admitting: Internal Medicine

## 2013-09-02 ENCOUNTER — Other Ambulatory Visit (INDEPENDENT_AMBULATORY_CARE_PROVIDER_SITE_OTHER): Payer: BC Managed Care – PPO

## 2013-09-02 DIAGNOSIS — E119 Type 2 diabetes mellitus without complications: Secondary | ICD-10-CM

## 2013-09-02 LAB — COMPREHENSIVE METABOLIC PANEL
AST: 22 U/L (ref 0–37)
Albumin: 4.2 g/dL (ref 3.5–5.2)
BUN: 12 mg/dL (ref 6–23)
CO2: 29 mEq/L (ref 19–32)
Calcium: 9.6 mg/dL (ref 8.4–10.5)
Chloride: 107 mEq/L (ref 96–112)
Creatinine, Ser: 0.7 mg/dL (ref 0.4–1.2)
GFR: 103.09 mL/min (ref >60.00)
Glucose, Bld: 119 mg/dL — ABNORMAL HIGH (ref 70–99)
Potassium: 5.1 mEq/L (ref 3.5–5.1)

## 2013-09-03 ENCOUNTER — Other Ambulatory Visit: Payer: BC Managed Care – PPO

## 2013-09-17 ENCOUNTER — Ambulatory Visit: Payer: BC Managed Care – PPO | Admitting: Internal Medicine

## 2013-09-23 ENCOUNTER — Other Ambulatory Visit: Payer: Self-pay | Admitting: Internal Medicine

## 2013-09-23 ENCOUNTER — Ambulatory Visit (INDEPENDENT_AMBULATORY_CARE_PROVIDER_SITE_OTHER): Payer: BC Managed Care – PPO | Admitting: Internal Medicine

## 2013-09-23 ENCOUNTER — Encounter: Payer: Self-pay | Admitting: Internal Medicine

## 2013-09-23 VITALS — BP 138/70 | HR 98 | Temp 98.4°F | Wt 184.0 lb

## 2013-09-23 DIAGNOSIS — E669 Obesity, unspecified: Secondary | ICD-10-CM

## 2013-09-23 DIAGNOSIS — E119 Type 2 diabetes mellitus without complications: Secondary | ICD-10-CM

## 2013-09-23 DIAGNOSIS — Z23 Encounter for immunization: Secondary | ICD-10-CM

## 2013-09-23 DIAGNOSIS — I10 Essential (primary) hypertension: Secondary | ICD-10-CM

## 2013-09-23 DIAGNOSIS — E1169 Type 2 diabetes mellitus with other specified complication: Secondary | ICD-10-CM

## 2013-09-23 MED ORDER — LOSARTAN POTASSIUM 50 MG PO TABS: 50.0000 mg | ORAL_TABLET | Freq: Every day | ORAL | Status: DC

## 2013-09-23 MED ORDER — METFORMIN HCL 500 MG PO TABS: 500.0000 mg | ORAL_TABLET | Freq: Two times a day (BID) | ORAL | Status: DC

## 2013-09-23 NOTE — Assessment & Plan Note (Signed)
Lab Results  Component Value Date   HGBA1C 6.6* 09/02/2013   Blood sugars have been well-controlled on metformin. Will continue.

## 2013-09-23 NOTE — Assessment & Plan Note (Signed)
BP Readings from Last 3 Encounters:  09/23/13 138/70  07/04/13 144/90  02/24/13 152/88   Blood pressure well-controlled with losartan and metoprolol. Will continue.

## 2013-09-23 NOTE — Telephone Encounter (Signed)
Eprescribed.

## 2013-09-23 NOTE — Assessment & Plan Note (Signed)
Wt Readings from Last 3 Encounters:  09/23/13 184 lb (83.462 kg)  07/04/13 180 lb (81.647 kg)  02/24/13 179 lb (81.194 kg)   Encouraged healthy diet and regular physical activity with goal of weight loss.

## 2013-09-23 NOTE — Progress Notes (Signed)
Subjective:    Patient ID: Kathryn Massey, female    DOB: 22-Jun-1966, 47 y.o.   MRN: [id number]  HPI 47 year old female with history of obesity, diabetes, hypertension presents for followup. She reports she is generally feeling well. Recent hemoglobin A1c was 6.6%. She has been compliant with metformin. No new concerns today.  Outpatient Prescriptions Prior to Visit  Medication Sig Dispense Refill  . Blood Glucose Monitoring Suppl (ONE TOUCH ULTRA SYSTEM KIT) W/DEVICE KIT 1 kit by Does not apply route once.  1 each  0  . glucose blood (ONE TOUCH ULTRA TEST) test strip Use as instructed  100 each  12  . losartan (COZAAR) 50 MG tablet Take 1 tablet (50 mg total) by mouth daily.  90 tablet  3  . metFORMIN (GLUCOPHAGE) 500 MG tablet Take 1 tablet (500 mg total) by mouth 2 (two) times daily with a meal.  180 tablet  3  . metoprolol succinate (TOPROL-XL) 100 MG 24 hr tablet Take 1 tablet (100 mg total) by mouth daily. Take with or immediately following a meal.  90 tablet  3   No facility-administered medications prior to visit.    No facility-administered encounter medications on file as of 09/23/2013.   BP 138/70  Pulse 98  Temp(Src) 98.4 F (36.9 C) (Oral)  Wt 184 lb (83.462 kg)  BMI 31.84 kg/m2  SpO2 98%  Review of Systems  Constitutional: Negative for fever, chills, appetite change, fatigue and unexpected weight change.  HENT: Negative for congestion, ear pain, sinus pressure, sore throat, trouble swallowing and voice change.   Eyes: Negative for visual disturbance.  Respiratory: Negative for cough, shortness of breath, wheezing and stridor.   Cardiovascular: Negative for chest pain, palpitations and leg swelling.  Gastrointestinal: Negative for nausea, vomiting, abdominal pain, diarrhea, constipation, blood in stool, abdominal distention and anal bleeding.  Genitourinary: Negative for dysuria and flank pain.  Musculoskeletal: Negative for arthralgias, gait problem, myalgias and  neck pain.  Skin: Negative for color change and rash.  Neurological: Negative for dizziness and headaches.  Hematological: Negative for adenopathy. Does not bruise/bleed easily.  Psychiatric/Behavioral: Negative for suicidal ideas, sleep disturbance and dysphoric mood. The patient is not nervous/anxious.        Objective:   Physical Exam  Constitutional: She is oriented to person, place, and time. She appears well-developed and well-nourished. No distress.  HENT:  Head: Normocephalic and atraumatic.  Right Ear: External ear normal.  Left Ear: External ear normal.  Nose: Nose normal.  Mouth/Throat: Oropharynx is clear and moist. No oropharyngeal exudate.  Eyes: Conjunctivae are normal. Pupils are equal, round, and reactive to light. Right eye exhibits no discharge. Left eye exhibits no discharge. No scleral icterus.  Neck: Normal range of motion. Neck supple. No tracheal deviation present. No thyromegaly present.  Cardiovascular: Normal rate, regular rhythm, normal heart sounds and intact distal pulses.  Exam reveals no gallop and no friction rub.   No murmur heard. Pulmonary/Chest: Effort normal and breath sounds normal. No accessory muscle usage. Not tachypneic. No respiratory distress. She has no decreased breath sounds. She has no wheezes. She has no rhonchi. She has no rales. She exhibits no tenderness.  Musculoskeletal: Normal range of motion. She exhibits no edema and no tenderness.  Lymphadenopathy:    She has no cervical adenopathy.  Neurological: She is alert and oriented to person, place, and time. No cranial nerve deficit. She exhibits normal muscle tone. Coordination normal.  Skin: Skin is warm and dry. No rash  noted. She is not diaphoretic. No erythema. No pallor.  Psychiatric: She has a normal mood and affect. Her behavior is normal. Judgment and thought content normal.          Assessment & Plan:

## 2013-10-15 ENCOUNTER — Encounter: Payer: Self-pay | Admitting: Internal Medicine

## 2013-12-25 ENCOUNTER — Other Ambulatory Visit (INDEPENDENT_AMBULATORY_CARE_PROVIDER_SITE_OTHER): Payer: BC Managed Care – PPO

## 2013-12-25 DIAGNOSIS — E119 Type 2 diabetes mellitus without complications: Secondary | ICD-10-CM

## 2013-12-25 LAB — COMPREHENSIVE METABOLIC PANEL
ALK PHOS: 59 U/L (ref 39–117)
ALT: 32 U/L (ref 0–35)
AST: 28 U/L (ref 0–37)
Albumin: 4.1 g/dL (ref 3.5–5.2)
BILIRUBIN TOTAL: 0.6 mg/dL (ref 0.3–1.2)
BUN: 14 mg/dL (ref 6–23)
CO2: 28 mEq/L (ref 19–32)
Calcium: 9.3 mg/dL (ref 8.4–10.5)
Chloride: 106 mEq/L (ref 96–112)
Creatinine, Ser: 0.6 mg/dL (ref 0.4–1.2)
GFR: 115.13 mL/min (ref >60.00)
Glucose, Bld: 111 mg/dL — ABNORMAL HIGH (ref 70–99)
POTASSIUM: 4.9 meq/L (ref 3.5–5.1)
Sodium: 141 mEq/L (ref 135–145)
TOTAL PROTEIN: 7.3 g/dL (ref 6.0–8.3)

## 2013-12-25 LAB — MICROALBUMIN / CREATININE URINE RATIO
Creatinine,U: 147.1 mg/dL
MICROALB UR: 0.6 mg/dL (ref 0.0–1.9)
Microalb Creat Ratio: 0.4 mg/g (ref 0.0–30.0)

## 2013-12-25 LAB — LIPID PANEL
CHOL/HDL RATIO: 4
CHOLESTEROL: 192 mg/dL (ref 0–200)
HDL: 49.3 mg/dL (ref >39.00)
LDL CALC: 118 mg/dL — AB (ref 0–99)
Triglycerides: 125 mg/dL (ref 0.0–149.0)
VLDL: 25 mg/dL (ref 0.0–40.0)

## 2013-12-25 LAB — HEMOGLOBIN A1C: Hgb A1c MFr Bld: 6.7 % — ABNORMAL HIGH (ref 4.6–6.5)

## 2013-12-26 ENCOUNTER — Ambulatory Visit: Payer: BC Managed Care – PPO | Admitting: Internal Medicine

## 2013-12-30 ENCOUNTER — Ambulatory Visit: Payer: BC Managed Care – PPO | Admitting: Internal Medicine

## 2014-01-30 ENCOUNTER — Ambulatory Visit: Payer: BC Managed Care – PPO | Admitting: Internal Medicine

## 2014-06-16 ENCOUNTER — Other Ambulatory Visit: Payer: Self-pay | Admitting: Internal Medicine

## 2014-06-24 LAB — HM PAP SMEAR: HM PAP: NORMAL

## 2014-06-26 ENCOUNTER — Ambulatory Visit: Payer: Self-pay

## 2014-06-26 LAB — HM MAMMOGRAPHY: HM Mammogram: NORMAL

## 2014-06-29 ENCOUNTER — Encounter: Payer: Self-pay | Admitting: *Deleted

## 2014-06-29 NOTE — Progress Notes (Signed)
Chart reviewed for DM bundle. No appt since 09/23/13. Sent mychart message on need for appointment and fasting lab appt.

## 2014-07-01 NOTE — Telephone Encounter (Signed)
Mailed unread message to pt  

## 2014-07-07 ENCOUNTER — Ambulatory Visit (INDEPENDENT_AMBULATORY_CARE_PROVIDER_SITE_OTHER): Payer: BC Managed Care – PPO | Admitting: Internal Medicine

## 2014-07-07 ENCOUNTER — Encounter: Payer: Self-pay | Admitting: Internal Medicine

## 2014-07-07 VITALS — BP 142/78 | HR 76 | Temp 98.3°F | Ht 63.75 in | Wt 192.2 lb

## 2014-07-07 DIAGNOSIS — E669 Obesity, unspecified: Secondary | ICD-10-CM

## 2014-07-07 DIAGNOSIS — M25562 Pain in left knee: Secondary | ICD-10-CM | POA: Insufficient documentation

## 2014-07-07 DIAGNOSIS — I10 Essential (primary) hypertension: Secondary | ICD-10-CM

## 2014-07-07 DIAGNOSIS — E119 Type 2 diabetes mellitus without complications: Secondary | ICD-10-CM

## 2014-07-07 DIAGNOSIS — E1169 Type 2 diabetes mellitus with other specified complication: Secondary | ICD-10-CM

## 2014-07-07 DIAGNOSIS — M25569 Pain in unspecified knee: Secondary | ICD-10-CM

## 2014-07-07 LAB — COMPREHENSIVE METABOLIC PANEL
ALK PHOS: 69 U/L (ref 39–117)
ALT: 42 U/L — AB (ref 0–35)
AST: 32 U/L (ref 0–37)
Albumin: 4.1 g/dL (ref 3.5–5.2)
BILIRUBIN TOTAL: 0.5 mg/dL (ref 0.2–1.2)
BUN: 13 mg/dL (ref 6–23)
CO2: 30 meq/L (ref 19–32)
Calcium: 9.9 mg/dL (ref 8.4–10.5)
Chloride: 101 mEq/L (ref 96–112)
Creatinine, Ser: 0.7 mg/dL (ref 0.4–1.2)
GFR: 89.85 mL/min (ref >60.00)
Glucose, Bld: 202 mg/dL — ABNORMAL HIGH (ref 70–99)
Potassium: 4.7 mEq/L (ref 3.5–5.1)
SODIUM: 139 meq/L (ref 135–145)
Total Protein: 6.9 g/dL (ref 6.0–8.3)

## 2014-07-07 LAB — LIPID PANEL
Cholesterol: 177 mg/dL (ref 0–200)
HDL: 44 mg/dL (ref >39.00)
NONHDL: 133
Total CHOL/HDL Ratio: 4
Triglycerides: 230 mg/dL — ABNORMAL HIGH (ref 0.0–149.0)
VLDL: 46 mg/dL — AB (ref 0.0–40.0)

## 2014-07-07 LAB — LDL CHOLESTEROL, DIRECT: Direct LDL: 119.9 mg/dL

## 2014-07-07 LAB — HM DIABETES FOOT EXAM: HM Diabetic Foot Exam: NORMAL

## 2014-07-07 LAB — MICROALBUMIN / CREATININE URINE RATIO
Creatinine,U: 101.9 mg/dL
Microalb Creat Ratio: 0.5 mg/g (ref 0.0–30.0)
Microalb, Ur: 0.5 mg/dL (ref 0.0–1.9)

## 2014-07-07 LAB — HEMOGLOBIN A1C: Hgb A1c MFr Bld: 7.2 % — ABNORMAL HIGH (ref 4.6–6.5)

## 2014-07-07 MED ORDER — GLUCOSE BLOOD VI STRP: ORAL_STRIP | Status: DC

## 2014-07-07 NOTE — Assessment & Plan Note (Signed)
Knee pain and instability concerning for medial meniscal tear. Will set up sports med evaluation.

## 2014-07-07 NOTE — Progress Notes (Signed)
Pre visit review using our clinic review tool, if applicable. No additional management support is needed unless otherwise documented below in the visit note. 

## 2014-07-07 NOTE — Assessment & Plan Note (Signed)
Lab Results  Component Value Date   HGBA1C 7.2* 07/07/2014   BG well controlled, however slightly higher than previous when trended. Encouraged healthy diet and weight loss. Will continue current medications.

## 2014-07-07 NOTE — Patient Instructions (Signed)
Labs today.   Follow up in 3 months.  

## 2014-07-07 NOTE — Assessment & Plan Note (Signed)
Wt Readings from Last 3 Encounters:  07/07/14 192 lb 4 oz (87.204 kg)  09/23/13 184 lb (83.462 kg)  07/04/13 180 lb (81.647 kg)   Body mass index is 33.27 kg/(m^2). Encouraged healthy diet and regular exercise with goal of weight loss.

## 2014-07-07 NOTE — Progress Notes (Signed)
Subjective:    Patient ID: Kathryn Massey, female    DOB: Apr 30, 1966, 48 y.o.   MRN: [id number]  HPI 48YO female presents for follow up.  DM - BG running near 120 in the morning and near 115 in the evening. Compliant with meds. [facility] [employer]  Pain in left knee anteriorly. Hurts worse with standing. No injury. Occasionally, gives way on her. Taking ibuprofen with minimal improvement. Also icing with minimal improvement.  Review of Systems  Constitutional: Negative for fever, chills, appetite change, fatigue and unexpected weight change.  Eyes: Negative for visual disturbance.  Respiratory: Negative for shortness of breath.   Cardiovascular: Negative for chest pain and leg swelling.  Gastrointestinal: Negative for abdominal pain.  Musculoskeletal: Positive for arthralgias and myalgias. Negative for back pain, gait problem and joint swelling.  Skin: Negative for color change and rash.  Neurological: Positive for weakness (intermittent left knee weakness). Negative for numbness.  Hematological: Negative for adenopathy. Does not bruise/bleed easily.  Psychiatric/Behavioral: Negative for dysphoric mood. The patient is not nervous/anxious.        Objective:    BP 142/78  Pulse 76  Temp(Src) 98.3 F (36.8 C) (Oral)  Ht 5' 3.75" (1.619 m)  Wt 192 lb 4 oz (87.204 kg)  BMI 33.27 kg/m2  SpO2 96% Physical Exam  Constitutional: She is oriented to person, place, and time. She appears well-developed and well-nourished. No distress.  HENT:  Head: Normocephalic and atraumatic.  Right Ear: External ear normal.  Left Ear: External ear normal.  Nose: Nose normal.  Mouth/Throat: Oropharynx is clear and moist. No oropharyngeal exudate.  Eyes: Conjunctivae are normal. Pupils are equal, round, and reactive to light. Right eye exhibits no discharge. Left eye exhibits no discharge. No scleral icterus.  Neck: Normal range of motion. Neck supple. No tracheal deviation present. No  thyromegaly present.  Cardiovascular: Normal rate, regular rhythm, normal heart sounds and intact distal pulses.  Exam reveals no gallop and no friction rub.   No murmur heard. Pulmonary/Chest: Effort normal and breath sounds normal. No accessory muscle usage. Not tachypneic. No respiratory distress. She has no decreased breath sounds. She has no wheezes. She has no rhonchi. She has no rales. She exhibits no tenderness.  Musculoskeletal: Normal range of motion. She exhibits no edema and no tenderness.       Left knee: She exhibits normal range of motion and no swelling. No tenderness found.  Lymphadenopathy:    She has no cervical adenopathy.  Neurological: She is alert and oriented to person, place, and time. No cranial nerve deficit. She exhibits normal muscle tone. Coordination normal.  Skin: Skin is warm and dry. No rash noted. She is not diaphoretic. No erythema. No pallor.  Psychiatric: She has a normal mood and affect. Her behavior is normal. Judgment and thought content normal.          Assessment & Plan:   Problem List Items Addressed This Visit     Unprioritized   Diabetes mellitus type 2, controlled - Primary      Lab Results  Component Value Date   HGBA1C 7.2* 07/07/2014   BG well controlled, however slightly higher than previous when trended. Encouraged healthy diet and weight loss. Will continue current medications.    Relevant Orders      Comprehensive metabolic panel (Completed)      Hemoglobin A1c (Completed)      Lipid panel (Completed)      Microalbumin / creatinine urine ratio (Completed)  Hypertension      BP Readings from Last 3 Encounters:  07/07/14 142/78  09/23/13 138/70  07/04/13 144/90   BP slightly elevated today, but has been better controlled at home per pt report. Will check renal function with labs. Pt will email with BP 1-2 times weekly. If no improvement, plan increase Losartan to 100mg  daily.    Left knee pain     Knee pain and  instability concerning for medial meniscal tear. Will set up sports med evaluation.    Relevant Orders      Ambulatory referral to Sports Medicine   Obesity (BMI 30-39.9)      Wt Readings from Last 3 Encounters:  07/07/14 192 lb 4 oz (87.204 kg)  09/23/13 184 lb (83.462 kg)  07/04/13 180 lb (81.647 kg)   Body mass index is 33.27 kg/(m^2). Encouraged healthy diet and regular exercise with goal of weight loss.     Other Visit Diagnoses   Diabetes mellitus type 2 in obese        Relevant Medications       glucose blood (ONE TOUCH ULTRA TEST) test strip        Return in about 3 months (around 10/07/2014) for Recheck of Diabetes.

## 2014-07-07 NOTE — Assessment & Plan Note (Signed)
BP Readings from Last 3 Encounters:  07/07/14 142/78  09/23/13 138/70  07/04/13 144/90   BP slightly elevated today, but has been better controlled at home per pt report. Will check renal function with labs. Pt will email with BP 1-2 times weekly. If no improvement, plan increase Losartan to 100mg  daily.

## 2014-07-13 ENCOUNTER — Ambulatory Visit (INDEPENDENT_AMBULATORY_CARE_PROVIDER_SITE_OTHER): Payer: BC Managed Care – PPO | Admitting: Family Medicine

## 2014-07-13 ENCOUNTER — Encounter: Payer: Self-pay | Admitting: Family Medicine

## 2014-07-13 ENCOUNTER — Other Ambulatory Visit (INDEPENDENT_AMBULATORY_CARE_PROVIDER_SITE_OTHER): Payer: BC Managed Care – PPO

## 2014-07-13 VITALS — BP 162/85 | HR 68 | Ht 63.0 in | Wt 191.0 lb

## 2014-07-13 DIAGNOSIS — M23204 Derangement of unspecified medial meniscus due to old tear or injury, left knee: Secondary | ICD-10-CM

## 2014-07-13 DIAGNOSIS — M23201 Derangement of unspecified lateral meniscus due to old tear or injury, left knee: Secondary | ICD-10-CM

## 2014-07-13 DIAGNOSIS — M23209 Derangement of unspecified meniscus due to old tear or injury, unspecified knee: Secondary | ICD-10-CM | POA: Insufficient documentation

## 2014-07-13 DIAGNOSIS — M25569 Pain in unspecified knee: Secondary | ICD-10-CM

## 2014-07-13 DIAGNOSIS — M23302 Other meniscus derangements, unspecified lateral meniscus, unspecified knee: Secondary | ICD-10-CM

## 2014-07-13 DIAGNOSIS — M23207 Derangement of unspecified meniscus due to old tear or injury, left knee: Secondary | ICD-10-CM

## 2014-07-13 DIAGNOSIS — M25562 Pain in left knee: Secondary | ICD-10-CM

## 2014-07-13 MED ORDER — DICLOFENAC SODIUM 2 % TD SOLN: TRANSDERMAL | Status: DC

## 2014-07-13 NOTE — Progress Notes (Signed)
  Tawana ScaleZach Smith D.O. [facility] [address] [place], [place] [address] Phone: [phone] Subjective:    I'm seeing this patient by the request  of:  Wynona DoveWALKER,JENNIFER AZBELL, MD   CC: Left knee pain  BJY:[id number]:Subjective Hassie BruceDeborah L Massey is a 48 y.o. female coming in with complaint of left knee pain. Patient states that it hurts worse when she is standing for long amount of time. Patient states after a long day and when she sits she actually has some difficulty with moving the knee. Patient states that over the course of time it seems to get better. Ibuprofen does help. Denies any radiation of the leg or any numbness. States that pain is approximately 7/10 when it occurs. States that she can sleep comfortably at night. Denies any radiation of the leg or any weakness. Denies any true injury to this knee.     Past medical history, social, surgical and family history all reviewed in electronic medical record.   Review of Systems: No headache, visual changes, nausea, vomiting, diarrhea, constipation, dizziness, abdominal pain, skin rash, fevers, chills, night sweats, weight loss, swollen lymph nodes, body aches, joint swelling, muscle aches, chest pain, shortness of breath, mood changes.   Objective Blood pressure 162/85, pulse 68, height 5\' 3"  (1.6 m), weight 191 lb (86.637 kg).  General: No apparent distress alert and oriented x3 mood and affect normal, dressed appropriately.  HEENT: Pupils equal, extraocular movements intact  Respiratory: Patient's speak in full sentences and does not appear short of breath  Cardiovascular: No lower extremity edema, non tender, no erythema  Skin: Warm dry intact with no signs of infection or rash on extremities or on axial skeleton.  Abdomen: Soft nontender  Neuro: Cranial nerves II through XII are intact, neurovascularly intact in all extremities with 2+ DTRs and 2+ pulses.  Lymph: No lymphadenopathy of posterior or anterior cervical chain or axillae  bilaterally.  Gait normal with good balance and coordination.  MSK:  Non tender with full range of motion and good stability and symmetric strength and tone of shoulders, elbows, wrist, hip, and ankles bilaterally.  Knee: Left Normal to inspection with no erythema or effusion or obvious bony abnormalities. Palpation reveals patient is tender to palpation over the medial joint line. ROM full in flexion and extension and lower leg rotation. Ligaments with solid consistent endpoints including ACL, PCL, LCL, MCL. Mildly positive Mcmurray's, Apley's, and Thessalonian tests. Non painful patellar compression. Patellar glide without crepitus. Patellar and quadriceps tendons unremarkable. Hamstring and quadriceps strength is normal.  Contralateral knee unremarkable  MSK US performed of: Left knee This study was ordered, performed, and interpreted by Terrilee FilesZach Smith D.O.  Knee: All structures visualized. The posterior medial meniscus had degenerative changes with some mild displacement but no true tear appreciated. Anteromedial, anterolateral,  and posterolateral menisci unremarkable without tearing, fraying, effusion, or displacement. Patellar Tendon unremarkable on long and transverse views without effusion. No abnormality of prepatellar bursa. LCL and MCL unremarkable on long and transverse views. No abnormality of origin of medial or lateral head of the gastrocnemius.  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      Impression and Recommendations:     This case required medical decision making of moderate complexity.

## 2014-07-13 NOTE — Patient Instructions (Signed)
Very nice to meet you Ice 20 minutes 2 times daily. Usually after activity and before bed. Exercises 3 times a week.  Pennsaid twice daily.  Wear brace with a lot of actiivty or exercise.  Come back in 3 weeks.  If not better I have other tricks.

## 2014-07-13 NOTE — Assessment & Plan Note (Signed)
Patient does have chronic meniscal changes that could be contributing to her pain. Otherwise patient's exam was fairly unremarkable today. I do not think there is any significant underlying arthritis we did not get further imaging. Patient was given a brace was fitted by me and showed proper positioning. We discussed icing protocol and home exercises. Patient will try topical anti-inflammatory and warned the potential side effects. Patient will try these interventions and come back again in 3 weeks. If continued to have trouble we'll consider a intra-articular injection.

## 2014-08-03 ENCOUNTER — Ambulatory Visit: Payer: Self-pay | Admitting: Physician Assistant

## 2014-08-03 ENCOUNTER — Ambulatory Visit (INDEPENDENT_AMBULATORY_CARE_PROVIDER_SITE_OTHER): Payer: BC Managed Care – PPO | Admitting: Family Medicine

## 2014-08-03 ENCOUNTER — Encounter: Payer: Self-pay | Admitting: Family Medicine

## 2014-08-03 VITALS — BP 142/84 | HR 77 | Ht 63.0 in | Wt 190.0 lb

## 2014-08-03 DIAGNOSIS — M23204 Derangement of unspecified medial meniscus due to old tear or injury, left knee: Principal | ICD-10-CM

## 2014-08-03 DIAGNOSIS — M23201 Derangement of unspecified lateral meniscus due to old tear or injury, left knee: Secondary | ICD-10-CM

## 2014-08-03 DIAGNOSIS — M23302 Other meniscus derangements, unspecified lateral meniscus, unspecified knee: Secondary | ICD-10-CM

## 2014-08-03 DIAGNOSIS — M23207 Derangement of unspecified meniscus due to old tear or injury, left knee: Principal | ICD-10-CM

## 2014-08-03 NOTE — Assessment & Plan Note (Signed)
Patient overall seems to be doing fairly well. Patient was given phase II exercises for strengthening and we discussed which activities she can do. Patient will start to slowly decrease the amount of wearing of the brace. Continue the same medications. Discussed icing. Patient declined formal physical therapy. Patient likely will continue to improve will follow up again in 3-4 weeks to make sure completely pain-free.  Spent greater than 25 minutes with patient face-to-face and had greater than 50% of counseling including as described above in assessment and plan.

## 2014-08-03 NOTE — Progress Notes (Signed)
  Kathryn Massey [address] [place], [place] [address] Phone: [phone] Subjective:     CC: Left knee pain followup  [id number] Kathryn Massey is a 48 y.o. female coming in with complaint of left knee pain. Patient was seen previously and had what appeared to be a chronic meniscal tear. Patient states that she is approximately 85% better. Patient continues with the bracing, icing, home exercises and will states that she can make a today with minimal pain. Patient states that she can have a dull aching pain if she stands on it most of her days. Denies any radiation any new pain. Denies any clicking, popping, or giving her. Patient denies any nighttime awakening. Patient is very happy with her results.     Past medical history, social, surgical and family history all reviewed in electronic medical record.   Review of Systems: No headache, visual changes, nausea, vomiting, diarrhea, constipation, dizziness, abdominal pain, skin rash, fevers, chills, night sweats, weight loss, swollen lymph nodes, body aches, joint swelling, muscle aches, chest pain, shortness of breath, mood changes.   Objective Blood pressure 142/84, pulse 77, height  (1.6 m), weight 190 lb (86.183 kg), SpO2 99.00%.  General: No apparent distress alert and oriented x3 mood and affect normal, dressed appropriately.  HEENT: Pupils equal, extraocular movements intact  Respiratory: Patient's speak in full sentences and does not appear short of breath  Cardiovascular: No lower extremity edema, non tender, no erythema  Skin: Warm dry intact with no signs of infection or rash on extremities or on axial skeleton.  Abdomen: Soft nontender  Neuro: Cranial nerves II through XII are intact, neurovascularly intact in all extremities with 2+ DTRs and 2+ pulses.  Lymph: No lymphadenopathy of posterior or anterior cervical chain or axillae bilaterally.  Gait normal with good balance and  coordination.  MSK:  Non tender with full range of motion and good stability and symmetric strength and tone of shoulders, elbows, wrist, hip, and ankles bilaterally.  Knee: Left Normal to inspection with no erythema or effusion or obvious bony abnormalities. Actually nontender over the medial joint line ROM full in flexion and extension and lower leg rotation. Ligaments with solid consistent endpoints including ACL, PCL, LCL, MCL. Mildly positive Mcmurray's, Apley's, and Thessalonian tests still present Non painful patellar compression. Patellar glide without crepitus. Patellar and quadriceps tendons unremarkable. Hamstring and quadriceps strength is normal.  Contralateral knee unremarkable  MSK US performed of: Left knee This study was ordered, performed, and interpreted by Terrilee Files D.O.  Knee: All structures visualized. The posterior medial meniscus had degenerative changes with Potts no displacement and  no true tear appreciated. Anteromedial, anterolateral,  and posterolateral menisci unremarkable without tearing, fraying, effusion, or displacement. Patellar Tendon unremarkable on long and transverse views without effusion. No abnormality of prepatellar bursa. LCL and MCL unremarkable on long and transverse views. No abnormality of origin of medial or lateral head of the gastrocnemius.  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      Impression and Recommendations:     This case required medical decision making of moderate complexity.

## 2014-08-03 NOTE — Patient Instructions (Signed)
Good to see you Ice 20 minutes 2 times daily. Usually after activity and before bed. Wear the brace less and less.  Continue the brace at work if needed.  See me again in 3 weeks if not perfect.

## 2014-08-04 ENCOUNTER — Ambulatory Visit: Payer: BC Managed Care – PPO | Admitting: Internal Medicine

## 2014-08-24 ENCOUNTER — Ambulatory Visit: Payer: BC Managed Care – PPO | Admitting: Family Medicine

## 2014-08-24 DIAGNOSIS — Z0289 Encounter for other administrative examinations: Secondary | ICD-10-CM

## 2014-09-13 ENCOUNTER — Other Ambulatory Visit: Payer: Self-pay | Admitting: Internal Medicine

## 2014-10-08 ENCOUNTER — Ambulatory Visit (INDEPENDENT_AMBULATORY_CARE_PROVIDER_SITE_OTHER): Payer: BC Managed Care – PPO | Admitting: Internal Medicine

## 2014-10-08 ENCOUNTER — Encounter: Payer: Self-pay | Admitting: Internal Medicine

## 2014-10-08 VITALS — BP 160/80 | HR 73 | Temp 98.6°F | Ht 63.75 in | Wt 189.0 lb

## 2014-10-08 DIAGNOSIS — Z23 Encounter for immunization: Secondary | ICD-10-CM

## 2014-10-08 DIAGNOSIS — I10 Essential (primary) hypertension: Secondary | ICD-10-CM

## 2014-10-08 DIAGNOSIS — E119 Type 2 diabetes mellitus without complications: Secondary | ICD-10-CM

## 2014-10-08 DIAGNOSIS — E669 Obesity, unspecified: Secondary | ICD-10-CM

## 2014-10-08 NOTE — Assessment & Plan Note (Addendum)
Pt reports reasonable control of blood sugars. We discussed that blood sugars may be more elevated when she is ill. Will check A1c with labs today. Continue Metformin.

## 2014-10-08 NOTE — Progress Notes (Signed)
Subjective:    Patient ID: Kathryn Massey, female    DOB: March 02, 1966, 48 y.o.   MRN: [id number]  HPI 48YO female presents for follow up.  DM - BG running near 130. Higher when having sinus infection and on antibiotics. Compliant with meds.  HTN - compliant with meds. Rushing to get here today. Has not checked BP lately.  No chest pain, headache.  Trying to exercise more, but finding it hard to incorporate exercise as she works full time and cares for her children.  No new concerns today.  Review of Systems  Constitutional: Negative for fever, chills, appetite change, fatigue and unexpected weight change.  HENT: Negative for congestion.   Eyes: Negative for visual disturbance.  Respiratory: Negative for shortness of breath.   Cardiovascular: Negative for chest pain and leg swelling.  Gastrointestinal: Negative for nausea, vomiting, abdominal pain, diarrhea and constipation.  Musculoskeletal: Negative for myalgias and arthralgias.  Skin: Negative for color change and rash.  Hematological: Negative for adenopathy. Does not bruise/bleed easily.  Psychiatric/Behavioral: Negative for sleep disturbance and dysphoric mood. The patient is not nervous/anxious.        Objective:    BP 160/80 mmHg  Pulse 73  Temp(Src) 98.6 F (37 C) (Oral)  Ht 5' 3.75" (1.619 m)  Wt 189 lb (85.73 kg)  BMI 32.71 kg/m2  SpO2 97% Physical Exam  Constitutional: She is oriented to person, place, and time. She appears well-developed and well-nourished. No distress.  HENT:  Head: Normocephalic and atraumatic.  Right Ear: External ear normal.  Left Ear: External ear normal.  Nose: Nose normal.  Mouth/Throat: Oropharynx is clear and moist. No oropharyngeal exudate.  Eyes: Conjunctivae are normal. Pupils are equal, round, and reactive to light. Right eye exhibits no discharge. Left eye exhibits no discharge. No scleral icterus.  Neck: Normal range of motion. Neck supple. No tracheal deviation  present. No thyromegaly present.  Cardiovascular: Normal rate, regular rhythm, normal heart sounds and intact distal pulses.  Exam reveals no gallop and no friction rub.   No murmur heard. Pulmonary/Chest: Effort normal and breath sounds normal. No accessory muscle usage. No tachypnea. No respiratory distress. She has no decreased breath sounds. She has no wheezes. She has no rhonchi. She has no rales. She exhibits no tenderness.  Musculoskeletal: Normal range of motion. She exhibits no edema or tenderness.  Lymphadenopathy:    She has no cervical adenopathy.  Neurological: She is alert and oriented to person, place, and time. No cranial nerve deficit. She exhibits normal muscle tone. Coordination normal.  Skin: Skin is warm and dry. No rash noted. She is not diaphoretic. No erythema. No pallor.  Psychiatric: She has a normal mood and affect. Her behavior is normal. Judgment and thought content normal.          Assessment & Plan:   Problem List Items Addressed This Visit      Unprioritized   Diabetes mellitus type 2, controlled - Primary    Pt reports reasonable control of blood sugars. We discussed that blood sugars may be more elevated when she is ill. Will check A1c with labs today. Continue Metformin.    Relevant Orders      Comprehensive metabolic panel      Hemoglobin A1c      Microalbumin / creatinine urine ratio      Lipid panel   Hypertension    BP Readings from Last 3 Encounters:  10/08/14 160/80  08/03/14 142/84  07/13/14 162/85  BP elevated today, however pt reports much better control at home. Will continue Losartan. We discussed increasing dose if BP continues to be elevated. She will check BP at home and email with readings.    Obesity (BMI 30-39.9)    Wt Readings from Last 3 Encounters:  10/08/14 189 lb (85.73 kg)  08/03/14 190 lb (86.183 kg)  07/13/14 191 lb (86.637 kg)   Encouraged effort at healthy diet and exercise. Discussed some ways to include  exercise in daily routine.        Return in about 3 months (around 01/08/2015) for Recheck of Diabetes.

## 2014-10-08 NOTE — Assessment & Plan Note (Addendum)
Wt Readings from Last 3 Encounters:  10/08/14 189 lb (85.73 kg)  08/03/14 190 lb (86.183 kg)  07/13/14 191 lb (86.637 kg)   Encouraged effort at healthy diet and exercise. Discussed some ways to include exercise in daily routine.

## 2014-10-08 NOTE — Progress Notes (Signed)
Pre visit review using our clinic review tool, if applicable. No additional management support is needed unless otherwise documented below in the visit note. 

## 2014-10-08 NOTE — Assessment & Plan Note (Addendum)
BP Readings from Last 3 Encounters:  10/08/14 160/80  08/03/14 142/84  07/13/14 162/85   BP elevated today, however pt reports much better control at home. Will continue Losartan. We discussed increasing dose if BP continues to be elevated. She will check BP at home and email with readings.

## 2014-10-08 NOTE — Patient Instructions (Signed)
Labs today.  Email with blood pressure readings over the next 1- 2 weeks.

## 2014-10-09 ENCOUNTER — Ambulatory Visit (INDEPENDENT_AMBULATORY_CARE_PROVIDER_SITE_OTHER): Payer: BC Managed Care – PPO | Admitting: *Deleted

## 2014-10-09 DIAGNOSIS — Z23 Encounter for immunization: Secondary | ICD-10-CM

## 2014-10-09 LAB — COMPREHENSIVE METABOLIC PANEL
ALK PHOS: 63 U/L (ref 39–117)
ALT: 41 U/L — ABNORMAL HIGH (ref 0–35)
AST: 33 U/L (ref 0–37)
Albumin: 3.7 g/dL (ref 3.5–5.2)
BILIRUBIN TOTAL: 0.4 mg/dL (ref 0.2–1.2)
BUN: 16 mg/dL (ref 6–23)
CO2: 27 mEq/L (ref 19–32)
CREATININE: 0.7 mg/dL (ref 0.4–1.2)
Calcium: 9.8 mg/dL (ref 8.4–10.5)
Chloride: 107 mEq/L (ref 96–112)
GFR: 99.09 mL/min (ref >60.00)
GLUCOSE: 114 mg/dL — AB (ref 70–99)
Potassium: 5.1 mEq/L (ref 3.5–5.1)
Sodium: 142 mEq/L (ref 135–145)
Total Protein: 7.3 g/dL (ref 6.0–8.3)

## 2014-10-09 LAB — LIPID PANEL
CHOLESTEROL: 195 mg/dL (ref 0–200)
HDL: 41.9 mg/dL (ref >39.00)
LDL CALC: 121 mg/dL — AB (ref 0–99)
NonHDL: 153.1
TRIGLYCERIDES: 160 mg/dL — AB (ref 0.0–149.0)
Total CHOL/HDL Ratio: 5
VLDL: 32 mg/dL (ref 0.0–40.0)

## 2014-10-09 LAB — HEMOGLOBIN A1C: HEMOGLOBIN A1C: 6.9 % — AB (ref 4.6–6.5)

## 2014-10-09 LAB — MICROALBUMIN / CREATININE URINE RATIO
Creatinine,U: 138.2 mg/dL
Microalb Creat Ratio: 0.9 mg/g (ref 0.0–30.0)
Microalb, Ur: 1.3 mg/dL (ref 0.0–1.9)

## 2014-10-13 LAB — HM DIABETES EYE EXAM

## 2014-11-18 ENCOUNTER — Other Ambulatory Visit: Payer: Self-pay | Admitting: Internal Medicine

## 2014-12-04 DIAGNOSIS — M8580 Other specified disorders of bone density and structure, unspecified site: Secondary | ICD-10-CM

## 2014-12-04 HISTORY — DX: Other specified disorders of bone density and structure, unspecified site: M85.80

## 2015-01-08 ENCOUNTER — Encounter: Payer: Self-pay | Admitting: Internal Medicine

## 2015-01-08 ENCOUNTER — Ambulatory Visit (INDEPENDENT_AMBULATORY_CARE_PROVIDER_SITE_OTHER): Payer: BC Managed Care – PPO | Admitting: Internal Medicine

## 2015-01-08 VITALS — BP 122/76 | HR 62 | Temp 98.2°F | Ht 63.75 in | Wt 183.5 lb

## 2015-01-08 DIAGNOSIS — I10 Essential (primary) hypertension: Secondary | ICD-10-CM

## 2015-01-08 DIAGNOSIS — E669 Obesity, unspecified: Secondary | ICD-10-CM

## 2015-01-08 DIAGNOSIS — E119 Type 2 diabetes mellitus without complications: Secondary | ICD-10-CM

## 2015-01-08 LAB — COMPREHENSIVE METABOLIC PANEL
ALT: 36 U/L — ABNORMAL HIGH (ref 0–35)
AST: 27 U/L (ref 0–37)
Albumin: 4.5 g/dL (ref 3.5–5.2)
Alkaline Phosphatase: 68 U/L (ref 39–117)
BILIRUBIN TOTAL: 0.5 mg/dL (ref 0.2–1.2)
BUN: 18 mg/dL (ref 6–23)
CALCIUM: 9.9 mg/dL (ref 8.4–10.5)
CO2: 32 mEq/L (ref 19–32)
Chloride: 103 mEq/L (ref 96–112)
Creatinine, Ser: 0.67 mg/dL (ref 0.40–1.20)
GFR: 98.99 mL/min (ref >60.00)
Glucose, Bld: 100 mg/dL — ABNORMAL HIGH (ref 70–99)
Potassium: 4.6 mEq/L (ref 3.5–5.1)
Sodium: 138 mEq/L (ref 135–145)
Total Protein: 7.5 g/dL (ref 6.0–8.3)

## 2015-01-08 LAB — HEMOGLOBIN A1C: HEMOGLOBIN A1C: 6.7 % — AB (ref 4.6–6.5)

## 2015-01-08 LAB — LIPID PANEL
Cholesterol: 162 mg/dL (ref 0–200)
HDL: 44.5 mg/dL (ref >39.00)
LDL CALC: 90 mg/dL (ref 0–99)
NonHDL: 117.5
TRIGLYCERIDES: 136 mg/dL (ref 0.0–149.0)
Total CHOL/HDL Ratio: 4
VLDL: 27.2 mg/dL (ref 0.0–40.0)

## 2015-01-08 LAB — MICROALBUMIN / CREATININE URINE RATIO
Creatinine,U: 63.2 mg/dL
MICROALB/CREAT RATIO: 2.2 mg/g (ref 0.0–30.0)
Microalb, Ur: 1.4 mg/dL (ref 0.0–1.9)

## 2015-01-08 NOTE — Patient Instructions (Addendum)
Labs today.  Return to clinic for nurse blood pressure check in 3-4 weeks.

## 2015-01-08 NOTE — Assessment & Plan Note (Signed)
BP Readings from Last 3 Encounters:  01/08/15 122/76  10/08/14 160/80  08/03/14 142/84   Few elevated BP readings. Will monitor for now. BP recheck in 3-4 weeks. Discussed increasing Losartan if BP continues to be elevated.

## 2015-01-08 NOTE — Assessment & Plan Note (Signed)
Wt Readings from Last 3 Encounters:  01/08/15 183 lb 8 oz (83.235 kg)  10/08/14 189 lb (85.73 kg)  08/03/14 190 lb (86.183 kg)   Congratulated pt on weight loss. Encouraged continued healthy diet and exercise. Follow up in 3 months.

## 2015-01-08 NOTE — Assessment & Plan Note (Signed)
Will check A1c with labs today. Recent eye exam normal. Foot exam UTD. Follow up in 3 months.

## 2015-01-08 NOTE — Progress Notes (Signed)
Subjective:    Patient ID: Kathryn Massey, female    DOB: Apr 10, 1966, 49 y.o.   MRN: [id number]  HPI  [name] presents for follow up.  DM - BG 108-110. Cut out softdrinks. Started 8 week weight challenge with [employer], in week 3.  Started back in gym with bike, circuit training, 3-5 days per week.  HTN - Compliant with meds. 140-145/70s.  Wt Readings from Last 3 Encounters:  01/08/15 183 lb 8 oz (83.235 kg)  10/08/14 189 lb (85.73 kg)  08/03/14 190 lb (86.183 kg)     Past medical, surgical, family and social history per today's encounter.  Review of Systems  Constitutional: Negative for fever, chills, appetite change, fatigue and unexpected weight change.  Eyes: Negative for visual disturbance.  Respiratory: Negative for shortness of breath.   Cardiovascular: Negative for chest pain and leg swelling.  Gastrointestinal: Negative for abdominal pain, diarrhea and constipation.  Musculoskeletal: Negative for myalgias and arthralgias.  Skin: Negative for color change and rash.  Hematological: Negative for adenopathy. Does not bruise/bleed easily.  Psychiatric/Behavioral: Negative for dysphoric mood. The patient is not nervous/anxious.        Objective:    BP 122/76 mmHg  Pulse 62  Temp(Src) 98.2 F (36.8 C) (Oral)  Ht 5' 3.75" (1.619 m)  Wt 183 lb 8 oz (83.235 kg)  BMI 31.76 kg/m2  SpO2 98% Physical Exam  Constitutional: She is oriented to person, place, and time. She appears well-developed and well-nourished. No distress.  HENT:  Head: Normocephalic and atraumatic.  Right Ear: External ear normal.  Left Ear: External ear normal.  Nose: Nose normal.  Mouth/Throat: Oropharynx is clear and moist. No oropharyngeal exudate.  Eyes: Conjunctivae are normal. Pupils are equal, round, and reactive to light. Right eye exhibits no discharge. Left eye exhibits no discharge. No scleral icterus.  Neck: Normal range of motion. Neck supple. No tracheal deviation  present. No thyromegaly present.  Cardiovascular: Normal rate, regular rhythm, normal heart sounds and intact distal pulses.  Exam reveals no gallop and no friction rub.   No murmur heard. Pulmonary/Chest: Effort normal and breath sounds normal. No respiratory distress. She has no wheezes. She has no rales. She exhibits no tenderness.  Musculoskeletal: Normal range of motion. She exhibits no edema or tenderness.  Lymphadenopathy:    She has no cervical adenopathy.  Neurological: She is alert and oriented to person, place, and time. No cranial nerve deficit. She exhibits normal muscle tone. Coordination normal.  Skin: Skin is warm and dry. No rash noted. She is not diaphoretic. No erythema. No pallor.  Psychiatric: She has a normal mood and affect. Her behavior is normal. Judgment and thought content normal.          Assessment & Plan:   Problem List Items Addressed This Visit      Unprioritized   Diabetes mellitus type 2, controlled - Primary    Will check A1c with labs today. Recent eye exam normal. Foot exam UTD. Follow up in 3 months.      Relevant Orders   Comprehensive metabolic panel   Hemoglobin A1c   Microalbumin / creatinine urine ratio   Lipid panel   Hypertension    BP Readings from Last 3 Encounters:  01/08/15 122/76  10/08/14 160/80  08/03/14 142/84   Few elevated BP readings. Will monitor for now. BP recheck in 3-4 weeks. Discussed increasing Losartan if BP continues to be elevated.      Obesity (BMI 30-39.9)  Wt Readings from Last 3 Encounters:  01/08/15 183 lb 8 oz (83.235 kg)  10/08/14 189 lb (85.73 kg)  08/03/14 190 lb (86.183 kg)   Congratulated pt on weight loss. Encouraged continued healthy diet and exercise. Follow up in 3 months.          Return in about 3 months (around 04/08/2015) for Recheck of Diabetes.

## 2015-01-08 NOTE — Progress Notes (Signed)
Pre visit review using our clinic review tool, if applicable. No additional management support is needed unless otherwise documented below in the visit note. 

## 2015-01-12 ENCOUNTER — Telehealth: Payer: Self-pay | Admitting: Internal Medicine

## 2015-01-12 NOTE — Telephone Encounter (Signed)
Error/gd °

## 2015-01-12 NOTE — Telephone Encounter (Signed)
emmi emailed °

## 2015-03-17 ENCOUNTER — Other Ambulatory Visit: Payer: Self-pay | Admitting: Internal Medicine

## 2015-04-12 ENCOUNTER — Ambulatory Visit: Payer: BC Managed Care – PPO | Admitting: Internal Medicine

## 2015-04-13 ENCOUNTER — Encounter: Payer: Self-pay | Admitting: Internal Medicine

## 2015-04-13 ENCOUNTER — Ambulatory Visit (INDEPENDENT_AMBULATORY_CARE_PROVIDER_SITE_OTHER): Payer: BC Managed Care – PPO | Admitting: Internal Medicine

## 2015-04-13 VITALS — BP 152/78 | HR 60 | Temp 98.1°F | Ht 63.75 in | Wt 184.2 lb

## 2015-04-13 DIAGNOSIS — E669 Obesity, unspecified: Secondary | ICD-10-CM | POA: Diagnosis not present

## 2015-04-13 DIAGNOSIS — I10 Essential (primary) hypertension: Secondary | ICD-10-CM | POA: Diagnosis not present

## 2015-04-13 DIAGNOSIS — E119 Type 2 diabetes mellitus without complications: Secondary | ICD-10-CM

## 2015-04-13 LAB — LIPID PANEL
CHOL/HDL RATIO: 4
Cholesterol: 192 mg/dL (ref 0–200)
HDL: 52.6 mg/dL (ref >39.00)
LDL CALC: 111 mg/dL — AB (ref 0–99)
NONHDL: 139.4
TRIGLYCERIDES: 141 mg/dL (ref 0.0–149.0)
VLDL: 28.2 mg/dL (ref 0.0–40.0)

## 2015-04-13 LAB — COMPREHENSIVE METABOLIC PANEL
ALBUMIN: 4.1 g/dL (ref 3.5–5.2)
ALT: 26 U/L (ref 0–35)
AST: 25 U/L (ref 0–37)
Alkaline Phosphatase: 70 U/L (ref 39–117)
BUN: 13 mg/dL (ref 6–23)
CALCIUM: 9.5 mg/dL (ref 8.4–10.5)
CO2: 28 meq/L (ref 19–32)
Chloride: 104 mEq/L (ref 96–112)
Creatinine, Ser: 0.61 mg/dL (ref 0.40–1.20)
GFR: 110.2 mL/min (ref >60.00)
Glucose, Bld: 132 mg/dL — ABNORMAL HIGH (ref 70–99)
Potassium: 4.6 mEq/L (ref 3.5–5.1)
SODIUM: 137 meq/L (ref 135–145)
Total Bilirubin: 0.4 mg/dL (ref 0.2–1.2)
Total Protein: 7.2 g/dL (ref 6.0–8.3)

## 2015-04-13 LAB — MICROALBUMIN / CREATININE URINE RATIO
Creatinine,U: 100.4 mg/dL
MICROALB UR: 0.8 mg/dL (ref 0.0–1.9)
Microalb Creat Ratio: 0.8 mg/g (ref 0.0–30.0)

## 2015-04-13 LAB — HEMOGLOBIN A1C: HEMOGLOBIN A1C: 6.8 % — AB (ref 4.6–6.5)

## 2015-04-13 NOTE — Progress Notes (Signed)
Pre visit review using our clinic review tool, if applicable. No additional management support is needed unless otherwise documented below in the visit note. 

## 2015-04-13 NOTE — Patient Instructions (Addendum)
Labs today.  Consider reading "Always Hungry" by Lawanda CousinsLudwig.  Follow up in 3 months or sooner as needed.

## 2015-04-13 NOTE — Assessment & Plan Note (Signed)
Will check A1c with labs today. Continue Metformin. 

## 2015-04-13 NOTE — Progress Notes (Signed)
Subjective:    Patient ID: Kathryn Massey, female    DOB: 11/11/1966, 49 y.o.   MRN: [id number]  HPI  [name] presents for follow up.  DM - BG running near 90-110. No BG over 200. Compliant with metformin.  HTN - Has not recently checked BP at home. Compliant with medication. Feeling well.  Trying to follow healthy diet with goal of weight loss. Not currently following any exercise program.  Wt Readings from Last 3 Encounters:  04/13/15 184 lb 4 oz (83.575 kg)  01/08/15 183 lb 8 oz (83.235 kg)  10/08/14 189 lb (85.73 kg)   BP Readings from Last 3 Encounters:  04/13/15 152/78  01/08/15 122/76  10/08/14 160/80     Past medical, surgical, family and social history per today's encounter.  Review of Systems  Constitutional: Negative for fever, chills, appetite change, fatigue and unexpected weight change.  Eyes: Negative for visual disturbance.  Respiratory: Negative for shortness of breath.   Cardiovascular: Negative for chest pain and leg swelling.  Gastrointestinal: Negative for nausea, vomiting, abdominal pain, diarrhea and constipation.  Skin: Negative for color change and rash.  Hematological: Negative for adenopathy. Does not bruise/bleed easily.  Psychiatric/Behavioral: Negative for dysphoric mood. The patient is not nervous/anxious.        Objective:    BP 152/78 mmHg  Pulse 60  Temp(Src) 98.1 F (36.7 C) (Oral)  Ht 5' 3.75" (1.619 m)  Wt 184 lb 4 oz (83.575 kg)  BMI 31.88 kg/m2  SpO2 100% Physical Exam  Constitutional: She is oriented to person, place, and time. She appears well-developed and well-nourished. No distress.  HENT:  Head: Normocephalic and atraumatic.  Right Ear: External ear normal.  Left Ear: External ear normal.  Nose: Nose normal.  Mouth/Throat: Oropharynx is clear and moist. No oropharyngeal exudate.  Eyes: Conjunctivae are normal. Pupils are equal, round, and reactive to light. Right eye exhibits no discharge. Left eye  exhibits no discharge. No scleral icterus.  Neck: Normal range of motion. Neck supple. No tracheal deviation present. No thyromegaly present.  Cardiovascular: Normal rate, regular rhythm, normal heart sounds and intact distal pulses.  Exam reveals no gallop and no friction rub.   No murmur heard. Pulmonary/Chest: Effort normal and breath sounds normal. No respiratory distress. She has no wheezes. She has no rales. She exhibits no tenderness.  Musculoskeletal: Normal range of motion. She exhibits no edema or tenderness.  Lymphadenopathy:    She has no cervical adenopathy.  Neurological: She is alert and oriented to person, place, and time. No cranial nerve deficit. She exhibits normal muscle tone. Coordination normal.  Skin: Skin is warm and dry. No rash noted. She is not diaphoretic. No erythema. No pallor.  Psychiatric: She has a normal mood and affect. Her behavior is normal. Judgment and thought content normal.          Assessment & Plan:   Problem List Items Addressed This Visit      Unprioritized   Diabetes mellitus type 2, controlled - Primary    Will check A1c with labs today. Continue Metformin.      Relevant Orders   Comprehensive metabolic panel   Hemoglobin A1c   Lipid panel   Microalbumin / creatinine urine ratio   Hypertension    BP Readings from Last 3 Encounters:  04/13/15 152/78  01/08/15 122/76  10/08/14 160/80   BP elevated today. However, has been well controlled historically. Will recheck BP next week. Consider increase in Losartan to  100mg  daily. Follow up in 3 months.      Obesity (BMI 30-39.9)    Wt Readings from Last 3 Encounters:  04/13/15 184 lb 4 oz (83.575 kg)  01/08/15 183 lb 8 oz (83.235 kg)  10/08/14 189 lb (85.73 kg)   Encouraged low-glycemic diet and exercise. Gave information on "Always Hungry" book by Kathryn Massey.          Return in about 3 months (around 07/14/2015) for Recheck.

## 2015-04-13 NOTE — Assessment & Plan Note (Signed)
BP Readings from Last 3 Encounters:  04/13/15 152/78  01/08/15 122/76  10/08/14 160/80   BP elevated today. However, has been well controlled historically. Will recheck BP next week. Consider increase in Losartan to 100mg  daily. Follow up in 3 months.

## 2015-04-13 NOTE — Assessment & Plan Note (Signed)
Wt Readings from Last 3 Encounters:  04/13/15 184 lb 4 oz (83.575 kg)  01/08/15 183 lb 8 oz (83.235 kg)  10/08/14 189 lb (85.73 kg)   Encouraged low-glycemic diet and exercise. Gave information on "Always Hungry" book by Lawanda CousinsLudwig.

## 2015-04-21 ENCOUNTER — Ambulatory Visit (INDEPENDENT_AMBULATORY_CARE_PROVIDER_SITE_OTHER): Payer: BC Managed Care – PPO

## 2015-04-21 VITALS — BP 162/78 | HR 56

## 2015-04-21 DIAGNOSIS — I10 Essential (primary) hypertension: Secondary | ICD-10-CM | POA: Diagnosis not present

## 2015-04-21 MED ORDER — LOSARTAN POTASSIUM 100 MG PO TABS: 100.0000 mg | ORAL_TABLET | Freq: Every day | ORAL | Status: DC

## 2015-04-21 NOTE — Progress Notes (Addendum)
   Subjective:    Patient ID: Kathryn Massey, female    DOB: [birth year], 49 y.o.   MRN: [id number]  HPI    Review of Systems     Objective:   Physical Exam        Assessment & Plan:  BP elevated, recommend increase Losartan to 100mg  daily. Recheck BP and check labs with BMP in 1 week.

## 2015-04-21 NOTE — Progress Notes (Signed)
Spoke to patient regarding her BP reading.  Verbalized understanding to increase her Losartan to 100mg  (which is 2 tablets of her prescribed original dosage).  Scheduled for follow up labs and BP recheck for 5.25.16.

## 2015-04-21 NOTE — Progress Notes (Signed)
Patient came in for BP check, Initial reading was 162/78, took her medication last night ( per self takes at night).  158/78 on recheck.  Rechecked on right arm 156/76.

## 2015-04-21 NOTE — Addendum Note (Signed)
Addended by: Ronna PolioWALKER, JENNIFER A on: 04/21/2015 09:14 AM   Modules accepted: Orders, Level of Service

## 2015-04-27 NOTE — Addendum Note (Signed)
Addended by: Kelly SplinterHAWKINS, Maddisen Vought B on: 04/27/2015 11:20 AM   Modules accepted: Level of Service

## 2015-04-27 NOTE — Progress Notes (Signed)
BP elevated, recommend increase Losartan to 100mg  daily. Recheck BP and check labs with BMP in 1 week.

## 2015-04-27 NOTE — Progress Notes (Signed)
   Subjective:    Patient ID: Kathryn Massey, female    DOB: 1966-10-27, 49 y.o.   MRN: [id number]  HPI    Review of Systems     Objective:   Physical Exam        Assessment & Plan:

## 2015-04-28 ENCOUNTER — Ambulatory Visit (INDEPENDENT_AMBULATORY_CARE_PROVIDER_SITE_OTHER): Payer: BC Managed Care – PPO | Admitting: *Deleted

## 2015-04-28 ENCOUNTER — Other Ambulatory Visit (INDEPENDENT_AMBULATORY_CARE_PROVIDER_SITE_OTHER): Payer: BC Managed Care – PPO

## 2015-04-28 VITALS — BP 150/90 | HR 64

## 2015-04-28 DIAGNOSIS — I10 Essential (primary) hypertension: Secondary | ICD-10-CM

## 2015-04-28 LAB — BASIC METABOLIC PANEL
BUN: 13 mg/dL (ref 6–23)
CALCIUM: 9.8 mg/dL (ref 8.4–10.5)
CHLORIDE: 103 meq/L (ref 96–112)
CO2: 30 mEq/L (ref 19–32)
Creatinine, Ser: 0.63 mg/dL (ref 0.40–1.20)
GFR: 106.15 mL/min (ref >60.00)
Glucose, Bld: 123 mg/dL — ABNORMAL HIGH (ref 70–99)
POTASSIUM: 4.9 meq/L (ref 3.5–5.1)
SODIUM: 139 meq/L (ref 135–145)

## 2015-04-28 NOTE — Progress Notes (Signed)
BP continues to be elevated. I would recommend starting Amlodipine 2.5mg  daily. #30 with 3 refills. Recheck BP in 4 weeks in a visit 

## 2015-04-28 NOTE — Progress Notes (Signed)
Pt presents for BP check. Doing well without complaints. BP here 150/90, HR 64. Confirmed taking Losartan 100 mg daily and Metoprolol 100 mg QD. Has not checked BP at home. Advised I would call or send mychart msg with further instructions,  verbalized understanding.

## 2015-05-22 ENCOUNTER — Other Ambulatory Visit: Payer: Self-pay | Admitting: Internal Medicine

## 2015-05-25 ENCOUNTER — Encounter: Payer: Self-pay | Admitting: Internal Medicine

## 2015-05-26 ENCOUNTER — Other Ambulatory Visit: Payer: Self-pay

## 2015-05-26 ENCOUNTER — Telehealth: Payer: Self-pay | Admitting: Internal Medicine

## 2015-05-26 ENCOUNTER — Encounter: Payer: Self-pay | Admitting: Emergency Medicine

## 2015-05-26 ENCOUNTER — Ambulatory Visit (INDEPENDENT_AMBULATORY_CARE_PROVIDER_SITE_OTHER)
Admission: EM | Admit: 2015-05-26 | Discharge: 2015-05-26 | Disposition: A | Discharge status: Other Acute Inpatient Hospital | Payer: BC Managed Care – PPO | Source: Home / Self Care | Attending: Family Medicine | Admitting: Family Medicine

## 2015-05-26 ENCOUNTER — Emergency Department
Admission: EM | Admit: 2015-05-26 | Discharge: 2015-05-26 | Disposition: A | Discharge status: Home / Self Care | Payer: BC Managed Care – PPO | Attending: Emergency Medicine | Admitting: Emergency Medicine

## 2015-05-26 DIAGNOSIS — R200 Anesthesia of skin: Secondary | ICD-10-CM

## 2015-05-26 DIAGNOSIS — Z79899 Other long term (current) drug therapy: Secondary | ICD-10-CM | POA: Insufficient documentation

## 2015-05-26 DIAGNOSIS — I10 Essential (primary) hypertension: Secondary | ICD-10-CM | POA: Insufficient documentation

## 2015-05-26 DIAGNOSIS — Z791 Long term (current) use of non-steroidal anti-inflammatories (NSAID): Secondary | ICD-10-CM | POA: Diagnosis not present

## 2015-05-26 DIAGNOSIS — R202 Paresthesia of skin: Principal | ICD-10-CM

## 2015-05-26 DIAGNOSIS — R51 Headache: Secondary | ICD-10-CM

## 2015-05-26 DIAGNOSIS — R519 Headache, unspecified: Secondary | ICD-10-CM

## 2015-05-26 HISTORY — DX: Type 2 diabetes mellitus without complications: E11.9

## 2015-05-26 LAB — CBC
HCT: 43 % (ref 35.0–47.0)
HEMOGLOBIN: 14 g/dL (ref 12.0–16.0)
MCH: 30.4 pg (ref 26.0–34.0)
MCHC: 32.6 g/dL (ref 32.0–36.0)
MCV: 93.1 fL (ref 80.0–100.0)
Platelets: 187 10*3/uL (ref 150–440)
RBC: 4.62 MIL/uL (ref 3.80–5.20)
RDW: 13.8 % (ref 11.5–14.5)
WBC: 8.6 10*3/uL (ref 3.6–11.0)

## 2015-05-26 LAB — BASIC METABOLIC PANEL
Anion gap: 12 (ref 5–15)
BUN: 12 mg/dL (ref 6–20)
CO2: 28 mmol/L (ref 22–32)
Calcium: 9.9 mg/dL (ref 8.9–10.3)
Chloride: 103 mmol/L (ref 101–111)
Creatinine, Ser: 0.63 mg/dL (ref 0.44–1.00)
GFR calc Af Amer: >60 mL/min (ref >60)
GLUCOSE: 160 mg/dL — AB (ref 65–99)
POTASSIUM: 4.6 mmol/L (ref 3.5–5.1)
SODIUM: 143 mmol/L (ref 135–145)

## 2015-05-26 LAB — TROPONIN I: Troponin I: <0.03 ng/mL (ref <0.031)

## 2015-05-26 NOTE — ED Provider Notes (Signed)
[facility] Emergency Department Provider Note  ____________________________________________  Time seen: 2:30 PM  I have reviewed the triage vital signs and the nursing notes.   HISTORY  Chief Complaint Numbness    HPI Kathryn Massey is a 49 y.o. female who complains of numbness and tingling in the left hand for the past 2 days. It is been constant. It started after she was pressure washing her house, and she reports alternating hands between holding the trigger in her right hand and holding the nozzle study with her left hand, and vice versa. Prior to that she is asymptomatic. She has no exertional symptoms. No chest pain or shortness of breath. Other than the paresthesia of the left hand she feels completely fine. It does not hurt to move the arm. She did not fall or sustain any injuries.No weakness in the left arm or anywhere else     Past Medical History  Diagnosis Date  . Hypertension   . Diabetes mellitus without complication     borderline    Patient Active Problem List   Diagnosis Date Noted  . Chronic meniscal tear of knee 07/13/2014  . Diabetes mellitus type 2, controlled 12/13/2012  . Hypertension 04/17/2012  . Obesity (BMI 30-39.9) 04/17/2012    Past Surgical History  Procedure Laterality Date  . Foot surgery      Dr. Vickki Muff    Current Outpatient Rx  Name  Route  Sig  Dispense  Refill  . Blood Glucose Monitoring Suppl (ONE TOUCH ULTRA SYSTEM KIT) W/DEVICE KIT   Does not apply   1 kit by Does not apply route once.   1 each   0   . Diclofenac Sodium 2 % SOLN      Apply twice daily.   112 g   1     [phone] (M)   . glucose blood (ONE TOUCH ULTRA TEST) test strip      Use as instructed   100 each   12   . losartan (COZAAR) 100 MG tablet   Oral   Take 1 tablet (100 mg total) by mouth daily.   30 tablet   6   . metFORMIN (GLUCOPHAGE) 500 MG tablet      TAKE 1 TABLET (500 MG TOTAL) BY MOUTH 2 (TWO) TIMES  DAILY WITH A MEAL.   180 tablet   5   . metoprolol succinate (TOPROL-XL) 100 MG 24 hr tablet      TAKE 1 TABLET BY MOUTH EVERY DAY (TAKE WITH OR IMMEDIATELY FOLLOWING A MEAL)   90 tablet   2     PLEASE REFILL METOPROLOL     Allergies Review of patient's allergies indicates no known allergies.  Family History  Problem Relation Age of Onset  . Hypertension Mother   . Diabetes Mother   . COPD Father   . Heart disease Brother   . Diabetes Brother     Social History History  Substance Use Topics  . Smoking status: Never Smoker   . Smokeless tobacco: Never Used  . Alcohol Use: Yes     Comment: occasionally    Review of Systems  Constitutional: No fever or chills. No weight changes Eyes:No blurry vision or double vision.  ENT: No sore throat. Cardiovascular: No chest pain. Respiratory: No dyspnea or cough. Gastrointestinal: Negative for abdominal pain, vomiting and diarrhea.  No BRBPR or melena. Genitourinary: Negative for dysuria, urinary retention, bloody urine, or difficulty urinating. Musculoskeletal: Negative for back pain. No joint  swelling or pain. Skin: Negative for rash. Neurological: Paresthesia left hand as above. Psychiatric:No anxiety or depression.   Endocrine:No hot/cold intolerance, changes in energy, or sleep difficulty.  10-point ROS otherwise negative.  ____________________________________________   PHYSICAL EXAM:  VITAL SIGNS: ED Triage Vitals  Enc Vitals Group     BP 05/26/15 1131 183/77 mmHg     Pulse Rate 05/26/15 1439 71     Resp 05/26/15 1131 20     Temp 05/26/15 1131 98.4 F (36.9 C)     Temp Source 05/26/15 1131 Oral     SpO2 05/26/15 1131 99 %     Weight 05/26/15 1131 185 lb (83.915 kg)     Height 05/26/15 1131 '5\' 3"'  (1.6 m)     Head Cir --      Peak Flow --      Pain Score --      Pain Loc --      Pain Edu? --      Excl. in [place]? --      Constitutional: Alert and oriented. Well appearing and in no distress. Eyes: No  scleral icterus. No conjunctival pallor. PERRL. EOMI ENT   Head: Normocephalic and atraumatic.   Nose: No congestion/rhinnorhea. No septal hematoma   Mouth/Throat: MMM, no pharyngeal erythema. No peritonsillar mass. No uvula shift.   Neck: No stridor. No SubQ emphysema. No meningismus. Hematological/Lymphatic/Immunilogical: No cervical lymphadenopathy. Cardiovascular: RRR. Normal and symmetric distal pulses are present in all extremities. No murmurs, rubs, or gallops. Respiratory: Normal respiratory effort without tachypnea nor retractions. Breath sounds are clear and equal bilaterally. No wheezes/rales/rhonchi. Gastrointestinal: Soft and nontender. No distention. There is no CVA tenderness.  No rebound, rigidity, or guarding. Genitourinary: deferred Musculoskeletal: Nontender with normal range of motion in all extremities. No joint effusions.  No lower extremity tenderness.  No edema. Phalen's test negative, Tinel's test negative Neurologic:   Normal speech and language.  CN 2-10 normal. Motor grossly intact. No pronator drift.  Normal gait. Sensation intact No gross focal neurologic deficits are appreciated.  Skin:  Skin is warm, dry and intact. No rash noted.  No petechiae, purpura, or bullae. Psychiatric: Mood and affect are normal. Speech and behavior are normal. Patient exhibits appropriate insight and judgment.  ____________________________________________    LABS (pertinent positives/negatives) (all labs ordered are listed, but only abnormal results are displayed) Labs Reviewed  BASIC METABOLIC PANEL - Abnormal; Notable for the following:    Glucose, Bld 160 (*)    All other components within normal limits  TROPONIN I  CBC   ____________________________________________   EKG  Interpreted by me  Date: 05/26/2015  Rate: 64  Rhythm: normal sinus rhythm  QRS Axis: normal  Intervals: normal  ST/T Wave abnormalities: normal  Conduction Disutrbances: none   Narrative Interpretation: unremarkable      ____________________________________________    RADIOLOGY    ____________________________________________   PROCEDURES  ____________________________________________   INITIAL IMPRESSION / ASSESSMENT AND PLAN / ED COURSE  Pertinent labs & imaging results that were available during my care of the patient were reviewed by me and considered in my medical decision making (see chart for details).  2 days of constant nonexertional pain since pressure washing and wraparound porch that goes around her whole house which was quite a large job. No symptoms to suggest that this is cardiopulmonary in nature. Very low suspicion of stroke fracture spinal impingement. This appears to be an innocent paresthesia due to nerve stunning. EKG and troponin are completely negative. We'll  discharge the patient home with reassurance have her follow up with primary care as needed. Counseled her T continue light activity of the left arm as this will increase blood flow and speed resolution of her symptoms.  ____________________________________________   FINAL CLINICAL IMPRESSION(S) / ED DIAGNOSES  Final diagnoses:  Paresthesias in left hand      Carrie Mew, MD 05/26/15 1456

## 2015-05-26 NOTE — Telephone Encounter (Signed)
FYI

## 2015-05-26 NOTE — ED Notes (Signed)
Pt. States" my left hand and arm have been kind of numb and tingly since yesterday"

## 2015-05-26 NOTE — Discharge Instructions (Signed)
Go to ER at this time for further evaluation and treatment.

## 2015-05-26 NOTE — ED Notes (Signed)
Patient c/o left arm numbness x2 days. Symptoms started after pressure washing the house. Denies any chest pain; denies headache. No obvious distress.

## 2015-05-26 NOTE — ED Provider Notes (Signed)
Patient presents today with symptoms of left arm numbness and tingling since yesterday. Patient denies any trauma or injury to the left arm. Patient denies any swelling of her significant weakness of the left arm. Patient has also had intermittent headache over the last few days. Patient does have a history of hypertension and also diabetes. Patient denies any chest pain, shortness of breath, diaphoresis, nausea, vomiting. Patient denies any neck pain at this time. Patient denies any history of any herniated or bulging disks in the cervical spine.  ROS: Negative except mentioned above. Vitals as per chart  GENERAL: NAD HEENT: no pharyngeal erythema, no exudate, no cervical LAD RESP: CTA B CARD: RRR MSK: no neck tenderness, -Spurlings, FROM of upper extremities, no significant weakness appreciated of extremities NEURO: CN II-XII groslly intact   A/P: LUE tingling/numbness w/intermittent headache-EKG shows normal sinus rhythm with nonspecific T-wave abnormality, patient does have elevated blood pressure in the office today, no focal neurological deficits noted, patient has not had any stress testing in the recent past, given patient's history of hypertension and diabetes would recommend further workup at the ER. Patient's family member to take patient there at this time. Nurse to call charge nurse to give report.    Jolene Provost, MD 05/26/15 1036

## 2015-05-26 NOTE — Discharge Instructions (Signed)

## 2015-05-26 NOTE — Telephone Encounter (Signed)
Patient Name: Kathryn Massey DOB: 05/19/1966 Initial Comment caller states she has numbness in her left arm and hand Nurse Assessment Nurse: Charna Elizabeth, RN, Lynden Ang Date/Time (Eastern Time): 05/26/2015 8:43:31 AM Confirm and document reason for call. If symptomatic, describe symptoms. ---Caller states she developed numbness again in her left arm and hand yesterday. No breathing or swallowing difficulty. Alert and responsive. Has the patient traveled out of the country within the last 30 days? ---No Does the patient require triage? ---Yes Related visit to physician within the last 2 weeks? ---No Does the PT have any chronic conditions? (i.e. diabetes, asthma, etc.) ---Yes List chronic conditions. ---High Blood Pressure, Diabetes, Blood Clot about 10 years ago Did the patient indicate they were pregnant? ---No Guidelines Guideline Title Affirmed Question Affirmed Notes Neurologic Deficit [1] Numbness (i.e., loss of sensation) of the face, arm or leg on one side of the body AND [2] sudden onset AND [3] transient (i.e., completely resolved) Final Disposition User Go to ED Now (or PCP triage) Charna Elizabeth, RN, Cathy Comments No appointments available within the next 4 hours. Kathryn Massey plans to go to [employer].

## 2015-06-28 LAB — HM MAMMOGRAPHY: HM MAMMO: NORMAL

## 2015-06-28 LAB — HM PAP SMEAR: HM Pap smear: NORMAL

## 2015-06-29 ENCOUNTER — Other Ambulatory Visit: Payer: Self-pay | Admitting: Certified Nurse Midwife

## 2015-06-29 DIAGNOSIS — Z780 Asymptomatic menopausal state: Secondary | ICD-10-CM

## 2015-06-29 DIAGNOSIS — Z13820 Encounter for screening for osteoporosis: Secondary | ICD-10-CM

## 2015-06-29 DIAGNOSIS — Z1231 Encounter for screening mammogram for malignant neoplasm of breast: Secondary | ICD-10-CM

## 2015-07-05 ENCOUNTER — Ambulatory Visit: Payer: BC Managed Care – PPO

## 2015-07-05 ENCOUNTER — Ambulatory Visit
Admission: RE | Admit: 2015-07-05 | Discharge: 2015-07-05 | Disposition: A | Discharge status: Home / Self Care | Payer: BC Managed Care – PPO | Source: Ambulatory Visit | Attending: Certified Nurse Midwife | Admitting: Certified Nurse Midwife

## 2015-07-05 DIAGNOSIS — Z13820 Encounter for screening for osteoporosis: Secondary | ICD-10-CM

## 2015-07-05 DIAGNOSIS — M8580 Other specified disorders of bone density and structure, unspecified site: Secondary | ICD-10-CM | POA: Diagnosis not present

## 2015-07-05 DIAGNOSIS — Z780 Asymptomatic menopausal state: Secondary | ICD-10-CM

## 2015-07-15 ENCOUNTER — Ambulatory Visit (INDEPENDENT_AMBULATORY_CARE_PROVIDER_SITE_OTHER): Payer: BC Managed Care – PPO | Admitting: Internal Medicine

## 2015-07-15 ENCOUNTER — Encounter: Payer: Self-pay | Admitting: Internal Medicine

## 2015-07-15 VITALS — BP 168/70 | HR 58 | Temp 98.2°F | Wt 187.0 lb

## 2015-07-15 DIAGNOSIS — Z1211 Encounter for screening for malignant neoplasm of colon: Secondary | ICD-10-CM | POA: Diagnosis not present

## 2015-07-15 DIAGNOSIS — M8580 Other specified disorders of bone density and structure, unspecified site: Secondary | ICD-10-CM | POA: Insufficient documentation

## 2015-07-15 DIAGNOSIS — E669 Obesity, unspecified: Secondary | ICD-10-CM | POA: Diagnosis not present

## 2015-07-15 DIAGNOSIS — I10 Essential (primary) hypertension: Secondary | ICD-10-CM

## 2015-07-15 DIAGNOSIS — E119 Type 2 diabetes mellitus without complications: Secondary | ICD-10-CM

## 2015-07-15 LAB — LIPID PANEL
CHOL/HDL RATIO: 4
CHOLESTEROL: 225 mg/dL — AB (ref 0–200)
HDL: 50.9 mg/dL (ref >39.00)
LDL Cholesterol: 137 mg/dL — ABNORMAL HIGH (ref 0–99)
NONHDL: 173.76
TRIGLYCERIDES: 183 mg/dL — AB (ref 0.0–149.0)
VLDL: 36.6 mg/dL (ref 0.0–40.0)

## 2015-07-15 LAB — COMPREHENSIVE METABOLIC PANEL
ALT: 33 U/L (ref 0–35)
AST: 25 U/L (ref 0–37)
Albumin: 4.6 g/dL (ref 3.5–5.2)
Alkaline Phosphatase: 78 U/L (ref 39–117)
BUN: 14 mg/dL (ref 6–23)
CHLORIDE: 102 meq/L (ref 96–112)
CO2: 28 mEq/L (ref 19–32)
Calcium: 10.1 mg/dL (ref 8.4–10.5)
Creatinine, Ser: 0.66 mg/dL (ref 0.40–1.20)
GFR: 100.52 mL/min (ref >60.00)
GLUCOSE: 138 mg/dL — AB (ref 70–99)
POTASSIUM: 5.2 meq/L — AB (ref 3.5–5.1)
Sodium: 140 mEq/L (ref 135–145)
Total Bilirubin: 0.5 mg/dL (ref 0.2–1.2)
Total Protein: 7.4 g/dL (ref 6.0–8.3)

## 2015-07-15 LAB — HEMOGLOBIN A1C: Hgb A1c MFr Bld: 6.8 % — ABNORMAL HIGH (ref 4.6–6.5)

## 2015-07-15 LAB — MICROALBUMIN / CREATININE URINE RATIO
CREATININE, U: 58.6 mg/dL
Microalb Creat Ratio: 1.4 mg/g (ref 0.0–30.0)

## 2015-07-15 LAB — VITAMIN D 25 HYDROXY (VIT D DEFICIENCY, FRACTURES): VITD: 26.45 ng/mL — ABNORMAL LOW (ref 30.00–100.00)

## 2015-07-15 MED ORDER — HYDROCHLOROTHIAZIDE 12.5 MG PO CAPS: 12.5000 mg | ORAL_CAPSULE | Freq: Every day | ORAL | Status: DC

## 2015-07-15 NOTE — Patient Instructions (Signed)
Start HCTZ 12.mg daily.  Follow up in 3 months.

## 2015-07-15 NOTE — Assessment & Plan Note (Signed)
Wt Readings from Last 3 Encounters:  07/15/15 187 lb (84.823 kg)  05/26/15 185 lb (83.915 kg)  05/26/15 185 lb (83.915 kg)   Encouraged healthy diet and exercise.

## 2015-07-15 NOTE — Assessment & Plan Note (Signed)
BP Readings from Last 3 Encounters:  07/15/15 168/70  05/26/15 169/80  05/26/15 167/80   BP elevated. Will add HCTZ and continue Losartan and Metoprolol. She will monitor BP at home. Email with update.

## 2015-07-15 NOTE — Progress Notes (Signed)
Subjective:    Patient ID: Kathryn Massey, female    DOB: [birth year], 49 y.o.   MRN: [id number]  HPI  [name] presents for follow up.  DM - Has not checked BG. Taking Metformin.  HTN - BP at home 140-150/80s. Compliant with medications. No CP, HA, palpitations.  Recently had bone density testing at [facility]. T-score -1.5. Has not started on any medications.  BP Readings from Last 3 Encounters:  07/15/15 168/70  05/26/15 169/80  05/26/15 167/80   Left arm tingling - A few months ago, had left arm tingling. Went to ED. Had troponin and EKG which were normal. No recurrent symptoms.  Past Medical History  Diagnosis Date  . Hypertension   . Diabetes mellitus without complication     borderline   Family History  Problem Relation Age of Onset  . Hypertension Mother   . Diabetes Mother   . COPD Father   . Heart disease Brother   . Diabetes Brother    Past Surgical History  Procedure Laterality Date  . Foot surgery      Dr. Ether Griffins   Social History   Social History  . Marital Status: Married    Spouse Name: N/A  . Number of Children: N/A  . Years of Education: N/A   Social History Main Topics  . Smoking status: Never Smoker   . Smokeless tobacco: Never Used  . Alcohol Use: Yes     Comment: occasionally  . Drug Use: No  . Sexual Activity: Not Asked   Other Topics Concern  . None   Social History Narrative   Lives in [place] with husband and daughter and son.     Work - [employer], nutritionist   Diet - Healthy   Exercise - walks 2-3 times per week.    Review of Systems  Constitutional: Negative for fever, chills, appetite change, fatigue and unexpected weight change.  Eyes: Negative for visual disturbance.  Respiratory: Negative for shortness of breath.   Cardiovascular: Negative for chest pain and leg swelling.  Gastrointestinal: Negative for nausea, vomiting, abdominal pain, diarrhea and constipation.  Musculoskeletal: Negative for  myalgias and arthralgias.  Skin: Negative for color change and rash.  Neurological: Negative for weakness, numbness and headaches.  Hematological: Negative for adenopathy. Does not bruise/bleed easily.  Psychiatric/Behavioral: Negative for suicidal ideas, sleep disturbance and dysphoric mood. The patient is not nervous/anxious.        Objective:    BP 168/70 mmHg  Pulse 58  Temp(Src) 98.2 F (36.8 C) (Oral)  Wt 187 lb (84.823 kg)  SpO2 99%  LMP  (LMP Unknown) Physical Exam  Constitutional: She is oriented to person, place, and time. She appears well-developed and well-nourished. No distress.  HENT:  Head: Normocephalic and atraumatic.  Right Ear: External ear normal.  Left Ear: External ear normal.  Nose: Nose normal.  Mouth/Throat: Oropharynx is clear and moist. No oropharyngeal exudate.  Eyes: Conjunctivae are normal. Pupils are equal, round, and reactive to light. Right eye exhibits no discharge. Left eye exhibits no discharge. No scleral icterus.  Neck: Normal range of motion. Neck supple. No tracheal deviation present. No thyromegaly present.  Cardiovascular: Normal rate, regular rhythm, normal heart sounds and intact distal pulses.  Exam reveals no gallop and no friction rub.   No murmur heard. Pulmonary/Chest: Effort normal and breath sounds normal. No respiratory distress. She has no wheezes. She has no rales. She exhibits no tenderness.  Musculoskeletal: Normal range of motion. She  exhibits no edema or tenderness.  Lymphadenopathy:    She has no cervical adenopathy.  Neurological: She is alert and oriented to person, place, and time. No cranial nerve deficit. She exhibits normal muscle tone. Coordination normal.  Skin: Skin is warm and dry. No rash noted. She is not diaphoretic. No erythema. No pallor.  Psychiatric: She has a normal mood and affect. Her behavior is normal. Judgment and thought content normal.          Assessment & Plan:   Problem List Items  Addressed This Visit      Unprioritized   Diabetes mellitus type 2, controlled - Primary    Will check A1c with labs. Continue Metformin.      Relevant Orders   Comprehensive metabolic panel   Hemoglobin A1c   Lipid panel   Hypertension    BP Readings from Last 3 Encounters:  07/15/15 168/70  05/26/15 169/80  05/26/15 167/80   BP elevated. Will add HCTZ and continue Losartan and Metoprolol. She will monitor BP at home. Email with update.      Relevant Medications   hydrochlorothiazide (MICROZIDE) 12.5 MG capsule   Other Relevant Orders   Microalbumin / creatinine urine ratio   Obesity (BMI 30-39.9)    Wt Readings from Last 3 Encounters:  07/15/15 187 lb (84.823 kg)  05/26/15 185 lb (83.915 kg)  05/26/15 185 lb (83.915 kg)   Encouraged healthy diet and exercise.      Osteopenia    Reviewed recent bone density testing with T-score -1.5. Will check Vit D level with labs. Encouraged adequate dietary intake of Calcium and regular exercise.      Relevant Orders   Vitamin D (25 hydroxy)   Special screening for malignant neoplasms, colon    Referral placed for colonoscopy.      Relevant Orders   Ambulatory referral to Gastroenterology       Return in about 3 months (around 10/15/2015) for Recheck of Diabetes.

## 2015-07-15 NOTE — Assessment & Plan Note (Signed)
Referral placed for colonoscopy. 

## 2015-07-15 NOTE — Assessment & Plan Note (Signed)
Will check A1c with labs. Continue Metformin. 

## 2015-07-15 NOTE — Progress Notes (Signed)
Pre visit review using our clinic review tool, if applicable. No additional management support is needed unless otherwise documented below in the visit note. 

## 2015-07-15 NOTE — Assessment & Plan Note (Signed)
Reviewed recent bone density testing with T-score -1.5. Will check Vit D level with labs. Encouraged adequate dietary intake of Calcium and regular exercise.

## 2015-09-24 ENCOUNTER — Encounter: Payer: Self-pay | Admitting: *Deleted

## 2015-09-27 ENCOUNTER — Encounter
Admission: RE | Disposition: A | Discharge status: Home / Self Care | Payer: Self-pay | Source: Ambulatory Visit | Attending: Gastroenterology

## 2015-09-27 ENCOUNTER — Ambulatory Visit
Admission: RE | Admit: 2015-09-27 | Discharge: 2015-09-27 | Disposition: A | Discharge status: Home / Self Care | Payer: BC Managed Care – PPO | Source: Ambulatory Visit | Attending: Gastroenterology | Admitting: Gastroenterology

## 2015-09-27 ENCOUNTER — Ambulatory Visit: Payer: BC Managed Care – PPO | Admitting: Certified Registered Nurse Anesthetist

## 2015-09-27 ENCOUNTER — Encounter: Payer: Self-pay | Admitting: *Deleted

## 2015-09-27 DIAGNOSIS — Z1211 Encounter for screening for malignant neoplasm of colon: Secondary | ICD-10-CM | POA: Insufficient documentation

## 2015-09-27 DIAGNOSIS — K648 Other hemorrhoids: Secondary | ICD-10-CM | POA: Insufficient documentation

## 2015-09-27 DIAGNOSIS — E119 Type 2 diabetes mellitus without complications: Secondary | ICD-10-CM | POA: Diagnosis not present

## 2015-09-27 DIAGNOSIS — Z7984 Long term (current) use of oral hypoglycemic drugs: Secondary | ICD-10-CM | POA: Insufficient documentation

## 2015-09-27 DIAGNOSIS — I10 Essential (primary) hypertension: Secondary | ICD-10-CM | POA: Diagnosis not present

## 2015-09-27 DIAGNOSIS — K5730 Diverticulosis of large intestine without perforation or abscess without bleeding: Secondary | ICD-10-CM | POA: Diagnosis not present

## 2015-09-27 DIAGNOSIS — K621 Rectal polyp: Secondary | ICD-10-CM | POA: Insufficient documentation

## 2015-09-27 HISTORY — PX: COLONOSCOPY WITH PROPOFOL: SHX5780

## 2015-09-27 LAB — GLUCOSE, CAPILLARY: GLUCOSE-CAPILLARY: 131 mg/dL — AB (ref 65–99)

## 2015-09-27 LAB — HM COLONOSCOPY: HM Colonoscopy: 2

## 2015-09-27 SURGERY — COLONOSCOPY WITH PROPOFOL
Anesthesia: General

## 2015-09-27 MED ORDER — FENTANYL CITRATE (PF) 100 MCG/2ML IJ SOLN
INTRAMUSCULAR | Status: DC | PRN
  Administered 2015-09-27: 50 ug via INTRAVENOUS

## 2015-09-27 MED ORDER — MIDAZOLAM HCL 5 MG/5ML IJ SOLN
INTRAMUSCULAR | Status: DC | PRN
  Administered 2015-09-27: 1 mg via INTRAVENOUS

## 2015-09-27 MED ORDER — SODIUM CHLORIDE 0.9 % IV SOLN
INTRAVENOUS | Status: DC
  Administered 2015-09-27: 1000 mL via INTRAVENOUS

## 2015-09-27 MED ORDER — PROPOFOL 500 MG/50ML IV EMUL
INTRAVENOUS | Status: DC | PRN
  Administered 2015-09-27: 120 ug/kg/min via INTRAVENOUS

## 2015-09-27 MED ORDER — SODIUM CHLORIDE 0.9 % IV SOLN: INTRAVENOUS | Status: DC

## 2015-09-27 MED ORDER — PROPOFOL 10 MG/ML IV BOLUS
INTRAVENOUS | Status: DC | PRN
  Administered 2015-09-27: 60 mg via INTRAVENOUS

## 2015-09-27 NOTE — Anesthesia Postprocedure Evaluation (Signed)
  Anesthesia Post-op Note  Patient: Leanor RubensteinDeborah Lynne Stehlin  Procedure(s) Performed: Procedure(s): COLONOSCOPY WITH PROPOFOL (N/A)  Anesthesia type:General  Patient location: PACU  Post pain: Pain level controlled  Post assessment: Post-op Vital signs reviewed, Patient's Cardiovascular Status Stable, Respiratory Function Stable, Patent Airway and No signs of Nausea or vomiting  Post vital signs: Reviewed and stable  Last Vitals:  Filed Vitals:   09/27/15 1010  BP:   Pulse: 53  Temp:   Resp: 15    Level of consciousness: awake, alert  and patient cooperative  Complications: No apparent anesthesia complications

## 2015-09-27 NOTE — H&P (Signed)
Outpatient short stay form Pre-procedure 09/27/2015 9:04 AM Kathryn Sails MD  Primary Physician: Dr. Ronette Deter  Reason for visit:  Screening colonoscopy  History of present illness:  Patient is a 49 year old female presenting for first colonoscopy. She tolerated her prep well. She takes no aspirin or anticoagulation medications.    Current facility-administered medications:  .  0.9 %  sodium chloride infusion, , Intravenous, Continuous, Kathryn Sails, MD, Last Rate: 20 mL/hr at 09/27/15 0900, 1,000 mL at 09/27/15 0900 .  0.9 %  sodium chloride infusion, , Intravenous, Continuous, Kathryn Sails, MD  Prescriptions prior to admission  Medication Sig Dispense Refill Last Dose  . metoprolol succinate (TOPROL-XL) 100 MG 24 hr tablet TAKE 1 TABLET BY MOUTH EVERY DAY (TAKE WITH OR IMMEDIATELY FOLLOWING A MEAL) 90 tablet 2 09/26/2015 at 2300  . Blood Glucose Monitoring Suppl (ONE TOUCH ULTRA SYSTEM KIT) W/DEVICE KIT 1 kit by Does not apply route once. 1 each 0 Taking  . Diclofenac Sodium 2 % SOLN Apply twice daily. (Patient not taking: Reported on 09/27/2015) 112 g 1 Completed Course at Unknown time  . glucose blood (ONE TOUCH ULTRA TEST) test strip Use as instructed 100 each 12 Taking  . hydrochlorothiazide (MICROZIDE) 12.5 MG capsule Take 1 capsule (12.5 mg total) by mouth daily. 90 capsule 3   . losartan (COZAAR) 100 MG tablet Take 1 tablet (100 mg total) by mouth daily. 30 tablet 6 Taking  . metFORMIN (GLUCOPHAGE) 500 MG tablet TAKE 1 TABLET (500 MG TOTAL) BY MOUTH 2 (TWO) TIMES DAILY WITH A MEAL. 180 tablet 5 Taking     No Known Allergies   Past Medical History  Diagnosis Date  . Hypertension   . Diabetes mellitus without complication ([place])     borderline    Review of systems:      Physical Exam    Heart and lungs: Regular rate and rhythm without rub or gallop, lungs are bilaterally clear    HEENT: Normocephalic atraumatic eyes are anicteric    Other:     Pertinant exam for procedure: Soft nontender nondistended bowel sounds positive normoactive    Planned proceedures: Colonoscopy and indicated procedures. I have discussed the risks benefits and complications of procedures to include not limited to bleeding, infection, perforation and the risk of sedation and the patient wishes to proceed.    Kathryn Sails, MD Gastroenterology 09/27/2015  9:04 AM

## 2015-09-27 NOTE — Op Note (Signed)
[facility] Gastroenterology Patient Name: Kathryn Massey Procedure Date: 09/27/2015 9:10 AM MRN: [id number] Account #: [id number] Date of Birth: 1966-06-09 Admit Type: Outpatient Age: 49 Room: [facility] ENDO ROOM 3 Gender: Female Note Status: Finalized Procedure:         Colonoscopy Indications:       Screening for colorectal malignant neoplasm, This is the                     patient's first colonoscopy Providers:         Christena Deem, MD Referring MD:      Ginette Pitman. Dan Humphreys, MD (Referring MD) Medicines:         Monitored Anesthesia Care Complications:     No immediate complications. Procedure:         Pre-Anesthesia Assessment:                    - ASA Grade Assessment: II - A patient with mild systemic                     disease.                    After obtaining informed consent, the colonoscope was                     passed under direct vision. Throughout the procedure, the                     patient's blood pressure, pulse, and oxygen saturations                     were monitored continuously. The Olympus PCF-H180AL                     colonoscope ( S#: [id number] ) was introduced through the                     anus and advanced to the the cecum, identified by                     appendiceal orifice and ileocecal valve. The colonoscopy                     was performed without difficulty. The patient tolerated                     the procedure well. The quality of the bowel preparation                     was good. Findings:      Two sessile polyps were found in the rectum. The polyps were 2 to 3 mm       in size. These polyps were removed with a cold biopsy forceps. Resection       and retrieval were complete.      Multiple medium-mouthed diverticula were found in the sigmoid colon, in       the descending colon and in the transverse colon.      Non-bleeding internal hemorrhoids were found during retroflexion. The       hemorrhoids were small.    The exam was otherwise without abnormality.      The digital rectal exam was normal. Impression:        - Two 2 to 3 mm polyps in  the rectum. Resected and                     retrieved.                    - Diverticulosis in the sigmoid colon, in the descending                     colon and in the transverse colon.                    - Non-bleeding internal hemorrhoids.                    - The examination was otherwise normal. Recommendation:    - Discharge patient to home.                    - Telephone GI clinic for pathology results in 1 week. Procedure Code(s): --- Professional ---                    [id number], Colonoscopy, flexible; with biopsy, single or                     multiple Diagnosis Code(s): --- Professional ---                    V76.51, Special screening for malignant neoplasms of colon                    569.0, Anal and rectal polyp                    455.0, Internal hemorrhoids without mention of complication                    562.10, Diverticulosis of colon (without mention of                     hemorrhage) CPT copyright 2014 American Medical Association. All rights reserved. The codes documented in this report are preliminary and upon coder review may  be revised to meet current compliance requirements. Christena DeemMartin U Jaylean Buenaventura, MD 09/27/2015 9:34:06 AM This report has been signed electronically. Number of Addenda: 0 Note Initiated On: 09/27/2015 9:10 AM Scope Withdrawal Time: 0 hours 5 minutes 31 seconds  Total Procedure Duration: 0 hours 15 minutes 20 seconds       [facility]

## 2015-09-27 NOTE — Anesthesia Preprocedure Evaluation (Signed)
Anesthesia Evaluation  Patient identified by MRN, date of birth, ID band Patient awake    Reviewed: Allergy & Precautions, H&P , NPO status , Patient's Chart, lab work & pertinent test results, reviewed documented beta blocker date and time   History of Anesthesia Complications Negative for: history of anesthetic complications  Airway Mallampati: II  TM Distance: >3 FB Neck ROM: full    Dental no notable dental hx. (+) Teeth Intact   Pulmonary neg pulmonary ROS,    Pulmonary exam normal breath sounds clear to auscultation       Cardiovascular Exercise Tolerance: Good hypertension, On Medications (-) angina(-) CAD, (-) Past MI, (-) Cardiac Stents and (-) CABG Normal cardiovascular exam(-) dysrhythmias (-) Valvular Problems/Murmurs Rhythm:regular Rate:Normal     Neuro/Psych negative neurological ROS  negative psych ROS   GI/Hepatic negative GI ROS, Neg liver ROS,   Endo/Other  diabetes, Well Controlled, Oral Hypoglycemic Agents  Renal/GU negative Renal ROS  negative genitourinary   Musculoskeletal   Abdominal   Peds  Hematology negative hematology ROS (+)   Anesthesia Other Findings Past Medical History:   Hypertension                                                 Diabetes mellitus without complication (HCC)                   Comment:borderline   Reproductive/Obstetrics negative OB ROS                             Anesthesia Physical Anesthesia Plan  ASA: II  Anesthesia Plan: General   Post-op Pain Management:    Induction:   Airway Management Planned:   Additional Equipment:   Intra-op Plan:   Post-operative Plan:   Informed Consent: I have reviewed the patients History and Physical, chart, labs and discussed the procedure including the risks, benefits and alternatives for the proposed anesthesia with the patient or authorized representative who has indicated his/her  understanding and acceptance.   Dental Advisory Given  Plan Discussed with: Anesthesiologist, CRNA and Surgeon  Anesthesia Plan Comments:         Anesthesia Quick Evaluation

## 2015-09-27 NOTE — Transfer of Care (Signed)
Immediate Anesthesia Transfer of Care Note  Patient: Kathryn RubensteinDeborah Lynne Massey  Procedure(s) Performed: Procedure(s): COLONOSCOPY WITH PROPOFOL (N/A)  Patient Location: PACU  Anesthesia Type:General  Level of Consciousness: sedated  Airway & Oxygen Therapy: Patient Spontanous Breathing and Patient connected to nasal cannula oxygen  Post-op Assessment: Report given to RN and Post -op Vital signs reviewed and stable  Post vital signs: Reviewed and stable  Last Vitals:  Filed Vitals:   09/27/15 0938  BP: 117/62  Pulse: 60  Temp: 36.6 C  Resp: 18    Complications: No apparent anesthesia complications

## 2015-09-28 LAB — SURGICAL PATHOLOGY

## 2015-09-30 ENCOUNTER — Encounter: Payer: Self-pay | Admitting: Gastroenterology

## 2015-10-15 ENCOUNTER — Ambulatory Visit: Payer: BC Managed Care – PPO | Admitting: Internal Medicine

## 2015-10-18 ENCOUNTER — Ambulatory Visit (INDEPENDENT_AMBULATORY_CARE_PROVIDER_SITE_OTHER): Payer: BC Managed Care – PPO | Admitting: Internal Medicine

## 2015-10-18 ENCOUNTER — Ambulatory Visit: Payer: BC Managed Care – PPO | Admitting: Internal Medicine

## 2015-10-18 ENCOUNTER — Encounter: Payer: Self-pay | Admitting: Internal Medicine

## 2015-10-18 VITALS — BP 151/76 | HR 66 | Temp 98.3°F | Ht 63.0 in | Wt 188.4 lb

## 2015-10-18 DIAGNOSIS — E119 Type 2 diabetes mellitus without complications: Secondary | ICD-10-CM | POA: Diagnosis not present

## 2015-10-18 DIAGNOSIS — I10 Essential (primary) hypertension: Secondary | ICD-10-CM

## 2015-10-18 LAB — HM DIABETES FOOT EXAM: HM DIABETIC FOOT EXAM: NORMAL

## 2015-10-18 NOTE — Patient Instructions (Signed)
Labs today.   Follow up in 3 months.  

## 2015-10-18 NOTE — Assessment & Plan Note (Signed)
BG well controlled by report. Will check A1c with labs. Continue metformin.

## 2015-10-18 NOTE — Progress Notes (Signed)
Pre visit review using our clinic review tool, if applicable. No additional management support is needed unless otherwise documented below in the visit note. 

## 2015-10-18 NOTE — Assessment & Plan Note (Signed)
BP Readings from Last 3 Encounters:  10/18/15 151/76  09/27/15 120/65  07/15/15 168/70   BP slightly elevated today, but generally has been well controlled. Renal function with labs. Continue current medication.

## 2015-10-18 NOTE — Progress Notes (Signed)
Subjective:    Patient ID: Kathryn Massey, female    DOB: [birth year], 49 y.o.   MRN: [id number]  HPI  [name] presents for follow up.  DM - BG well controlled. No BG over 200. Compliant with medication.  HTN - Does not generally check BP. No CP, HA, palpitations.  Wt Readings from Last 3 Encounters:  10/18/15 188 lb 6 oz (85.446 kg)  09/24/15 187 lb (84.823 kg)  07/15/15 187 lb (84.823 kg)   BP Readings from Last 3 Encounters:  10/18/15 151/76  09/27/15 120/65  07/15/15 168/70    Past Medical History  Diagnosis Date  . Hypertension   . Diabetes mellitus without complication (HCC)     borderline   Family History  Problem Relation Age of Onset  . Hypertension Mother   . Diabetes Mother   . COPD Father   . Heart disease Brother   . Diabetes Brother    Past Surgical History  Procedure Laterality Date  . Foot surgery      Dr. Ether GriffinsFowler  . Colonoscopy with propofol N/A 09/27/2015    Procedure: COLONOSCOPY WITH PROPOFOL;  Surgeon: Christena DeemMartin U Skulskie, MD;  Location[facility] ENDOSCOPY;  Service: Endoscopy;  Laterality: N/A;   Social History   Social History  . Marital Status: Married    Spouse Name: N/A  . Number of Children: N/A  . Years of Education: N/A   Social History Main Topics  . Smoking status: Never Smoker   . Smokeless tobacco: Never Used  . Alcohol Use: Yes     Comment: occasionally  . Drug Use: No  . Sexual Activity: Not Asked   Other Topics Concern  . None   Social History Narrative   [place] in [place] with husband and daughter and son.     Work - [employer], nutritionist   Diet - Healthy   Exercise - walks 2-3 times per week.    Review of Systems  Constitutional: Negative for fever, chills, appetite change, fatigue and unexpected weight change.  Eyes: Negative for visual disturbance.  Respiratory: Negative for shortness of breath.   Cardiovascular: Negative for chest pain and leg swelling.  Gastrointestinal: Negative for  nausea, vomiting, abdominal pain, diarrhea and constipation.  Musculoskeletal: Negative for myalgias and arthralgias.  Skin: Negative for color change and rash.  Hematological: Negative for adenopathy. Does not bruise/bleed easily.  Psychiatric/Behavioral: Negative for sleep disturbance and dysphoric mood. The patient is not nervous/anxious.        Objective:    BP 151/76 mmHg  Pulse 66  Temp(Src) 98.3 F (36.8 C) (Oral)  Ht 5\' 3"  (1.6 m)  Wt 188 lb 6 oz (85.446 kg)  BMI 33.38 kg/m2  SpO2 99%  LMP  (LMP Unknown) Physical Exam  Constitutional: She is oriented to person, place, and time. She appears well-developed and well-nourished. No distress.  HENT:  Head: Normocephalic and atraumatic.  Right Ear: External ear normal.  Left Ear: External ear normal.  Nose: Nose normal.  Mouth/Throat: Oropharynx is clear and moist. No oropharyngeal exudate.  Eyes: Conjunctivae are normal. Pupils are equal, round, and reactive to light. Right eye exhibits no discharge. Left eye exhibits no discharge. No scleral icterus.  Neck: Normal range of motion. Neck supple. No tracheal deviation present. No thyromegaly present.  Cardiovascular: Normal rate, regular rhythm, normal heart sounds and intact distal pulses.  Exam reveals no gallop and no friction rub.   No murmur heard. Pulmonary/Chest: Effort normal and breath sounds normal.  No respiratory distress. She has no wheezes. She has no rales. She exhibits no tenderness.  Musculoskeletal: Normal range of motion. She exhibits no edema or tenderness.  Lymphadenopathy:    She has no cervical adenopathy.  Neurological: She is alert and oriented to person, place, and time. No cranial nerve deficit. She exhibits normal muscle tone. Coordination normal.  Skin: Skin is warm and dry. No rash noted. She is not diaphoretic. No erythema. No pallor.  Psychiatric: She has a normal mood and affect. Her behavior is normal. Judgment and thought content normal.           Assessment & Plan:   Problem List Items Addressed This Visit      Unprioritized   Diabetes mellitus type 2, controlled (HCC) - Primary    BG well controlled by report. Will check A1c with labs. Continue metformin.      Relevant Orders   Comprehensive metabolic panel   Hemoglobin A1c   Lipid panel   Hypertension    BP Readings from Last 3 Encounters:  10/18/15 151/76  09/27/15 120/65  07/15/15 168/70   BP slightly elevated today, but generally has been well controlled. Renal function with labs. Continue current medication.          Return in about 3 months (around 01/18/2016) for Recheck of Diabetes.

## 2015-10-19 LAB — COMPREHENSIVE METABOLIC PANEL
ALK PHOS: 64 U/L (ref 39–117)
ALT: 33 U/L (ref 0–35)
AST: 27 U/L (ref 0–37)
Albumin: 4.5 g/dL (ref 3.5–5.2)
BUN: 16 mg/dL (ref 6–23)
CHLORIDE: 102 meq/L (ref 96–112)
CO2: 28 meq/L (ref 19–32)
Calcium: 10.4 mg/dL (ref 8.4–10.5)
Creatinine, Ser: 0.68 mg/dL (ref 0.40–1.20)
GFR: 97.01 mL/min (ref >60.00)
GLUCOSE: 99 mg/dL (ref 70–99)
POTASSIUM: 4.7 meq/L (ref 3.5–5.1)
SODIUM: 140 meq/L (ref 135–145)
Total Bilirubin: 0.4 mg/dL (ref 0.2–1.2)
Total Protein: 7.5 g/dL (ref 6.0–8.3)

## 2015-10-19 LAB — LIPID PANEL
CHOL/HDL RATIO: 4
Cholesterol: 192 mg/dL (ref 0–200)
HDL: 44.2 mg/dL (ref >39.00)
LDL Cholesterol: 111 mg/dL — ABNORMAL HIGH (ref 0–99)
NonHDL: 148.03
TRIGLYCERIDES: 183 mg/dL — AB (ref 0.0–149.0)
VLDL: 36.6 mg/dL (ref 0.0–40.0)

## 2015-10-19 LAB — HEMOGLOBIN A1C: Hgb A1c MFr Bld: 7.2 % — ABNORMAL HIGH (ref 4.6–6.5)

## 2015-10-21 LAB — HM DIABETES EYE EXAM

## 2015-11-09 LAB — HM DIABETES EYE EXAM

## 2015-11-14 ENCOUNTER — Other Ambulatory Visit: Payer: Self-pay | Admitting: Internal Medicine

## 2015-12-14 ENCOUNTER — Other Ambulatory Visit: Payer: Self-pay | Admitting: Internal Medicine

## 2016-04-19 ENCOUNTER — Ambulatory Visit (INDEPENDENT_AMBULATORY_CARE_PROVIDER_SITE_OTHER): Payer: BC Managed Care – PPO | Admitting: Internal Medicine

## 2016-04-19 ENCOUNTER — Encounter: Payer: Self-pay | Admitting: Internal Medicine

## 2016-04-19 VITALS — BP 148/80 | HR 69 | Ht 63.0 in | Wt 188.4 lb

## 2016-04-19 DIAGNOSIS — E119 Type 2 diabetes mellitus without complications: Secondary | ICD-10-CM | POA: Diagnosis not present

## 2016-04-19 DIAGNOSIS — I10 Essential (primary) hypertension: Secondary | ICD-10-CM

## 2016-04-19 NOTE — Progress Notes (Signed)
Subjective:    Patient ID: Kathryn Massey, female    DOB: May 17, 1966, 50 y.o.   MRN: [id number]  HPI  50YO female presents for follow up.  DM - Not checking BG much, but generally well controlled when checked.  Walking in the mornings before work.  HTN - No CP, HA. Compliant with medication.    Wt Readings from Last 3 Encounters:  04/19/16 188 lb 6.4 oz (85.458 kg)  10/18/15 188 lb 6 oz (85.446 kg)  09/24/15 187 lb (84.823 kg)   BP Readings from Last 3 Encounters:  04/19/16 148/80  10/18/15 151/76  09/27/15 120/65    Past Medical History  Diagnosis Date  . Hypertension   . Diabetes mellitus without complication (HCC)     borderline   Family History  Problem Relation Age of Onset  . Hypertension Mother   . Diabetes Mother   . COPD Father   . Heart disease Brother   . Diabetes Brother    Past Surgical History  Procedure Laterality Date  . Foot surgery      Dr. Ether GriffinsFowler  . Colonoscopy with propofol N/A 09/27/2015    Procedure: COLONOSCOPY WITH PROPOFOL;  Surgeon: Christena DeemMartin U Skulskie, MD;  Location[facility] END[facility]COPY;  Service: Endoscopy;  Laterality: N/A;   Social History   Social History  . Marital Status: Married    Spouse Name: N/A  . Number of Children: N/A  . Years of Education: N/A   Social History Main Topics  . Smoking status: Never Smoker   . Smokeless tobacco: Never Used  . Alcohol Use: Yes     Comment: occasionally  . Drug Use: No  . Sexual Activity: Not Asked   Other Topics Concern  . None   Social History Narrative   [place] in [place] and daughter and son.     Work - [employer], nutritionist   Diet - Healthy   Exercise - walks 2-3 times per week.    Review of Systems  Constitutional: Negative for fever, chills, appetite change, fatigue and unexpected weight change.  Eyes: Negative for visual disturbance.  Respiratory: Negative for cough and shortness of breath.   Cardiovascular: Negative for chest pain and leg  swelling.  Gastrointestinal: Negative for nausea, vomiting, abdominal pain, diarrhea and constipation.  Skin: Negative for color change and rash.  Hematological: Negative for adenopathy. Does not bruise/bleed easily.  Psychiatric/Behavioral: Negative for suicidal ideas, confusion, sleep disturbance and dysphoric mood. The patient is not nervous/anxious.        Objective:    BP 148/80 mmHg  Pulse 69  Ht 5\' 3"  (1.6 m)  Wt 188 lb 6.4 oz (85.458 kg)  BMI 33.38 kg/m2  SpO2 98%  LMP  (LMP Unknown) Physical Exam  Constitutional: She is oriented to person, place, and time. She appears well-developed and well-nourished. No distress.  HENT:  Head: Normocephalic and atraumatic.  Right Ear: External ear normal.  Left Ear: External ear normal.  Nose: Nose normal.  Mouth/Throat: Oropharynx is clear and moist. No oropharyngeal exudate.  Eyes: Conjunctivae are normal. Pupils are equal, round, and reactive to light. Right eye exhibits no discharge. Left eye exhibits no discharge. No scleral icterus.  Neck: Normal range of motion. Neck supple. No tracheal deviation present. No thyromegaly present.  Cardiovascular: Normal rate, regular rhythm, normal heart sounds and intact distal pulses.  Exam reveals no gallop and no friction rub.   No murmur heard. Pulmonary/Chest: Effort normal and breath sounds normal. No  respiratory distress. She has no wheezes. She has no rales. She exhibits no tenderness.  Musculoskeletal: Normal range of motion. She exhibits no edema or tenderness.  Lymphadenopathy:    She has no cervical adenopathy.  Neurological: She is alert and oriented to person, place, and time. No cranial nerve deficit. She exhibits normal muscle tone. Coordination normal.  Skin: Skin is warm and dry. No rash noted. She is not diaphoretic. No erythema. No pallor.  Psychiatric: She has a normal mood and affect. Her behavior is normal. Judgment and thought content normal.          Assessment &  Plan:   Problem List Items Addressed This Visit      Unprioritized   Diabetes mellitus type 2, controlled (HCC) - Primary    Will check A1c with labs. Continue Meformin.      Relevant Orders   Comprehensive metabolic panel   Hemoglobin A1c   Hypertension    BP Readings from Last 3 Encounters:  04/19/16 148/80  10/18/15 151/76  09/27/15 120/65   BP generally well controlled. Renal function with labs. Continue current medications.          Return in about 6 months (around 10/20/2016) for Recheck of Diabetes.  Ronna Polio, MD Internal Medicine [facility]

## 2016-04-19 NOTE — Progress Notes (Signed)
Pre visit review using our clinic review tool, if applicable. No additional management support is needed unless otherwise documented below in the visit note. 

## 2016-04-19 NOTE — Patient Instructions (Signed)
Labs today.  Follow up in 6 months. 

## 2016-04-19 NOTE — Assessment & Plan Note (Signed)
BP Readings from Last 3 Encounters:  04/19/16 148/80  10/18/15 151/76  09/27/15 120/65   BP generally well controlled. Renal function with labs. Continue current medications.

## 2016-04-19 NOTE — Assessment & Plan Note (Signed)
Will check A1c with labs. Continue Meformin.

## 2016-04-20 LAB — COMPREHENSIVE METABOLIC PANEL
ALT: 29 U/L (ref 0–35)
AST: 23 U/L (ref 0–37)
Albumin: 4.5 g/dL (ref 3.5–5.2)
Alkaline Phosphatase: 56 U/L (ref 39–117)
BILIRUBIN TOTAL: 0.4 mg/dL (ref 0.2–1.2)
BUN: 14 mg/dL (ref 6–23)
CALCIUM: 9.8 mg/dL (ref 8.4–10.5)
CHLORIDE: 101 meq/L (ref 96–112)
CO2: 29 meq/L (ref 19–32)
Creatinine, Ser: 0.7 mg/dL (ref 0.40–1.20)
GFR: 93.63 mL/min (ref >60.00)
GLUCOSE: 154 mg/dL — AB (ref 70–99)
POTASSIUM: 4.5 meq/L (ref 3.5–5.1)
Sodium: 138 mEq/L (ref 135–145)
Total Protein: 7 g/dL (ref 6.0–8.3)

## 2016-04-20 LAB — HEMOGLOBIN A1C: Hgb A1c MFr Bld: 7.5 % — ABNORMAL HIGH (ref 4.6–6.5)

## 2016-06-20 ENCOUNTER — Other Ambulatory Visit: Payer: Self-pay | Admitting: Certified Nurse Midwife

## 2016-06-20 DIAGNOSIS — Z1231 Encounter for screening mammogram for malignant neoplasm of breast: Secondary | ICD-10-CM

## 2016-06-23 ENCOUNTER — Encounter: Payer: Self-pay | Admitting: Family Medicine

## 2016-06-23 ENCOUNTER — Ambulatory Visit (INDEPENDENT_AMBULATORY_CARE_PROVIDER_SITE_OTHER): Payer: BC Managed Care – PPO | Admitting: Family Medicine

## 2016-06-23 VITALS — BP 156/90 | HR 79 | Temp 98.3°F | Wt 186.0 lb

## 2016-06-23 DIAGNOSIS — R51 Headache: Secondary | ICD-10-CM | POA: Diagnosis not present

## 2016-06-23 DIAGNOSIS — R519 Headache, unspecified: Secondary | ICD-10-CM | POA: Insufficient documentation

## 2016-06-23 MED ORDER — BUTALBITAL-APAP-CAFFEINE 50-325-40 MG PO TABS: 1.0000 | ORAL_TABLET | Freq: Four times a day (QID) | ORAL | Status: DC | PRN

## 2016-06-23 NOTE — Patient Instructions (Signed)
Use the medication as needed.  Enjoy the beach.  Take care  Dr. Adriana Simasook

## 2016-06-23 NOTE — Progress Notes (Signed)
Subjective:  Patient ID: Leanor Rubensteineborah Lynne Berns, female    DOB: 1966/08/12  Age: 50 y.o. MRN: [id number]  CC: Headache  HPI:  50 year old female presents with complaints of headache.  Patient states that she's had a headache since Wednesday. Located in the maxillary region as well as the frontal region. Also located in the occipital region. She denies any associated nausea, photophobia, phonophobia. She's taken Tylenol and Motrin with some improvement but no complete resolution. No known exacerbating factors. Additionally, patient has had some other symptoms which do not seem to be related. She's had a few episodes of diarrhea and has also had an episode of sweating and palpitations. She denies any associated chest pain or shortness of breath. Resolved after sitting. Has not recurred.  Social Hx   Social History   Social History  . Marital Status: Married    Spouse Name: N/A  . Number of Children: N/A  . Years of Education: N/A   Social History Main Topics  . Smoking status: Never Smoker   . Smokeless tobacco: Never Used  . Alcohol Use: Yes     Comment: occasionally  . Drug Use: No  . Sexual Activity: Not Asked   Other Topics Concern  . None   Social History Narrative   [place]ives in [place] with husband and daughter and son.     Work - [employer], nutritionist   Diet - Healthy   Exercise - walks 2-3 times per week.   Review of Systems  Cardiovascular: Positive for palpitations.  Gastrointestinal: Positive for diarrhea.  Neurological: Positive for headaches.   Objective:  BP 156/90 mmHg  Pulse 79  Temp(Src) 98.3 F (36.8 C) (Oral)  Wt 186 lb (84.369 kg)  SpO2 98%  LMP  (LMP Unknown)  BP/Weight 06/23/2016 04/19/2016 10/18/2015  Systolic BP 156 148 151  Diastolic BP 90 80 76  Wt. (Lbs) 186 188.4 188.38  BMI 32.96 33.38 33.38   Physical Exam  Constitutional: She is oriented to person, place, and time. She appears well-developed. No distress.  Cardiovascular:  Normal rate and regular rhythm.   Pulmonary/Chest: Effort normal. She has no wheezes. She has no rales.  Neurological: She is alert and oriented to person, place, and time.  No focal deficits.  Psychiatric: She has a normal mood and affect.  Vitals reviewed.  Lab Results  Component Value Date   WBC 8.6 05/26/2015   HGB 14.0 05/26/2015   HCT 43.0 05/26/2015   PLT 187 05/26/2015   GLUCOSE 154* 04/19/2016   CHOL 192 10/18/2015   TRIG 183.0* 10/18/2015   HDL 44.20 10/18/2015   LDLDIRECT 119.9 07/07/2014   LDLCALC 111* 10/18/2015   ALT 29 04/19/2016   AST 23 04/19/2016   NA 138 04/19/2016   K 4.5 04/19/2016   CL 101 04/19/2016   CREATININE 0.70 04/19/2016   BUN 14 04/19/2016   CO2 29 04/19/2016   HGBA1C 7.5* 04/19/2016   MICROALBUR <0.8 07/15/2015   Assessment & Plan:   Problem List Items Addressed This Visit    Headache - Primary    New acute problem. Exam unremarkable. Suspect tension headache. Treating with Fioricet.      Relevant Medications   butalbital-acetaminophen-caffeine (FIORICET, ESGIC) 50-325-40 MG tablet      Meds ordered this encounter  Medications  . butalbital-acetaminophen-caffeine (FIORICET, ESGIC) 50-325-40 MG tablet    Sig: Take 1 tablet by mouth every 6 (six) hours as needed for headache.    Dispense:  30 tablet  Refill:  0    Follow-up: PRN  [place]

## 2016-06-23 NOTE — Progress Notes (Signed)
Pre visit review using our clinic review tool, if applicable. No additional management support is needed unless otherwise documented below in the visit note. 

## 2016-06-23 NOTE — Assessment & Plan Note (Signed)
New acute problem. Exam unremarkable. Suspect tension headache. Treating with Fioricet.

## 2016-07-01 ENCOUNTER — Other Ambulatory Visit: Payer: Self-pay | Admitting: Internal Medicine

## 2016-07-01 DIAGNOSIS — I10 Essential (primary) hypertension: Secondary | ICD-10-CM

## 2016-07-05 ENCOUNTER — Ambulatory Visit
Admission: RE | Admit: 2016-07-05 | Discharge: 2016-07-05 | Disposition: A | Discharge status: Home / Self Care | Payer: BC Managed Care – PPO | Source: Ambulatory Visit | Attending: Certified Nurse Midwife | Admitting: Certified Nurse Midwife

## 2016-07-05 ENCOUNTER — Other Ambulatory Visit: Payer: Self-pay | Admitting: Certified Nurse Midwife

## 2016-07-05 DIAGNOSIS — Z1231 Encounter for screening mammogram for malignant neoplasm of breast: Secondary | ICD-10-CM | POA: Insufficient documentation

## 2016-07-07 ENCOUNTER — Encounter: Payer: Self-pay | Admitting: Internal Medicine

## 2016-07-07 ENCOUNTER — Ambulatory Visit (INDEPENDENT_AMBULATORY_CARE_PROVIDER_SITE_OTHER): Payer: BC Managed Care – PPO | Admitting: Internal Medicine

## 2016-07-07 VITALS — BP 158/80 | HR 61 | Temp 98.2°F | Resp 18 | Wt 188.2 lb

## 2016-07-07 DIAGNOSIS — I10 Essential (primary) hypertension: Secondary | ICD-10-CM

## 2016-07-07 DIAGNOSIS — E669 Obesity, unspecified: Secondary | ICD-10-CM | POA: Diagnosis not present

## 2016-07-07 DIAGNOSIS — E119 Type 2 diabetes mellitus without complications: Secondary | ICD-10-CM | POA: Diagnosis not present

## 2016-07-07 DIAGNOSIS — E1169 Type 2 diabetes mellitus with other specified complication: Secondary | ICD-10-CM

## 2016-07-07 MED ORDER — METFORMIN HCL 500 MG PO TABS: ORAL_TABLET | ORAL | 4 refills | Status: DC

## 2016-07-07 MED ORDER — GLUCOSE BLOOD VI STRP: ORAL_STRIP | 12 refills | Status: DC

## 2016-07-07 NOTE — Patient Instructions (Signed)
Labs in 2-3 weeks.  Follow up in 4 weeks for new patient visit.

## 2016-07-07 NOTE — Assessment & Plan Note (Signed)
Will check A1c with labs in 2 weeks. Continue Metformin. Follow up with Dr. Adriana Simas as new patient in 4 weeks.

## 2016-07-07 NOTE — Progress Notes (Signed)
Pre visit review using our clinic review tool, if applicable. No additional management support is needed unless otherwise documented below in the visit note. 

## 2016-07-07 NOTE — Assessment & Plan Note (Signed)
BP Readings from Last 3 Encounters:  07/07/16 (!) 158/80  06/23/16 (!) 156/90  04/19/16 (!) 148/80   BP well controlled at home however higher in clinic. Will monitor for now. Discussed adding Amlodipine if BP continues to be elevated.

## 2016-07-07 NOTE — Progress Notes (Signed)
Subjective:    Patient ID: Kathryn Massey, female    DOB: July 12, 1966, 50 y.o.   MRN: [id number]  HPI  50YO female presents for follow up.  DM - BG running near 110-130s.  Compliant with medication.  Lab Results  Component Value Date   HGBA1C 7.5 (H) 04/19/2016   HTN - BP at home mostly 135-155/75-80. Compliant with medication.  Wt Readings from Last 3 Encounters:  07/07/16 188 lb 4 oz (85.4 kg)  06/23/16 186 lb (84.4 kg)  04/19/16 188 lb 6.4 oz (85.5 kg)   BP Readings from Last 3 Encounters:  07/07/16 (!) 158/80  06/23/16 (!) 156/90  04/19/16 (!) 148/80    Past Medical History:  Diagnosis Date  . Diabetes mellitus without complication (HCC)    borderline  . Hypertension    Family History  Problem Relation Age of Onset  . Hypertension Mother   . Diabetes Mother   . COPD Father   . Heart disease Brother   . Diabetes Brother    Past Surgical History:  Procedure Laterality Date  . COLONOSCOPY WITH PROPOFOL N/A 09/27/2015   Procedure: COLONOSCOPY WITH PROPOFOL;  Surgeon: Christena Deem, MD;  Location: [facility] ENDOSCOPY;  Service: Endoscopy;  Laterality: N/A;  . FOOT SURGERY     Dr. Ether Griffins   Social History   Social History  . Marital status: Married    Spouse name: N/A  . Number of children: N/A  . Years of education: N/A   Social History Main Topics  . Smoking status: Never Smoker  . Smokeless tobacco: Never Used  . Alcohol use Yes     Comment: occasionally  . Drug use: No  . Sexual activity: Not Asked   Other Topics Concern  . None   Social History Narrative   Lives in [place] with husband and daughter and son.     Work - [employer], nutritionist   Diet - Healthy   Exercise - walks 2-3 times per week.    Review of Systems  Constitutional: Negative for appetite change, chills, fatigue, fever and unexpected weight change.  Eyes: Negative for visual disturbance.  Respiratory: Negative for cough and shortness of breath.     Cardiovascular: Negative for chest pain and leg swelling.  Gastrointestinal: Negative for abdominal pain, constipation, diarrhea, nausea and vomiting.  Musculoskeletal: Negative for arthralgias and myalgias.  Skin: Negative for color change and rash.  Hematological: Negative for adenopathy. Does not bruise/bleed easily.  Psychiatric/Behavioral: Negative for dysphoric mood and sleep disturbance. The patient is not nervous/anxious.        Objective:    BP (!) 158/80   Pulse 61   Temp 98.2 F (36.8 C) (Oral)   Resp 18   Wt 188 lb 4 oz (85.4 kg)   LMP  (LMP Unknown)   SpO2 98%   BMI 33.35 kg/m  Physical Exam  Constitutional: She is oriented to person, place, and time. She appears well-developed and well-nourished. No distress.  HENT:  Head: Normocephalic and atraumatic.  Right Ear: External ear normal.  Left Ear: External ear normal.  Nose: Nose normal.  Mouth/Throat: Oropharynx is clear and moist. No oropharyngeal exudate.  Eyes: Conjunctivae are normal. Pupils are equal, round, and reactive to light. Right eye exhibits no discharge. Left eye exhibits no discharge. No scleral icterus.  Neck: Normal range of motion. Neck supple. No tracheal deviation present. No thyromegaly present.  Cardiovascular: Normal rate, regular rhythm, normal heart sounds and intact distal pulses.  Exam reveals no gallop and no friction rub.   No murmur heard. Pulmonary/Chest: Effort normal and breath sounds normal. No respiratory distress. She has no wheezes. She has no rales. She exhibits no tenderness.  Musculoskeletal: Normal range of motion. She exhibits no edema or tenderness.  Lymphadenopathy:    She has no cervical adenopathy.  Neurological: She is alert and oriented to person, place, and time. No cranial nerve deficit. She exhibits normal muscle tone. Coordination normal.  Skin: Skin is warm and dry. No rash noted. She is not diaphoretic. No erythema. No pallor.  Psychiatric: She has a normal  mood and affect. Her behavior is normal. Judgment and thought content normal.          Assessment & Plan:   Problem List Items Addressed This Visit      Unprioritized   Diabetes mellitus type 2 in obese (HCC) - Primary (Chronic)    Will check A1c with labs in 2 weeks. Continue Metformin. Follow up with Dr. Adriana Simas as new patient in 4 weeks.      Relevant Medications   glucose blood (ONE TOUCH ULTRA TEST) test strip   metFORMIN (GLUCOPHAGE) 500 MG tablet   Hypertension (Chronic)    BP Readings from Last 3 Encounters:  07/07/16 (!) 158/80  06/23/16 (!) 156/90  04/19/16 (!) 148/80   BP well controlled at home however higher in clinic. Will monitor for now. Discussed adding Amlodipine if BP continues to be elevated.       Other Visit Diagnoses   None.      Return in about 4 weeks (around 08/04/2016) for New Patient.  Ronna Polio, MD Internal Medicine [facility]

## 2016-07-21 ENCOUNTER — Other Ambulatory Visit (INDEPENDENT_AMBULATORY_CARE_PROVIDER_SITE_OTHER): Payer: BC Managed Care – PPO

## 2016-07-21 DIAGNOSIS — E119 Type 2 diabetes mellitus without complications: Secondary | ICD-10-CM | POA: Diagnosis not present

## 2016-07-21 LAB — COMPREHENSIVE METABOLIC PANEL
ALBUMIN: 4.4 g/dL (ref 3.5–5.2)
ALK PHOS: 54 U/L (ref 39–117)
ALT: 34 U/L (ref 0–35)
AST: 27 U/L (ref 0–37)
BUN: 14 mg/dL (ref 6–23)
CALCIUM: 9.8 mg/dL (ref 8.4–10.5)
CHLORIDE: 102 meq/L (ref 96–112)
CO2: 30 mEq/L (ref 19–32)
Creatinine, Ser: 0.72 mg/dL (ref 0.40–1.20)
GFR: 90.55 mL/min (ref >60.00)
Glucose, Bld: 155 mg/dL — ABNORMAL HIGH (ref 70–99)
POTASSIUM: 4.8 meq/L (ref 3.5–5.1)
Sodium: 140 mEq/L (ref 135–145)
TOTAL PROTEIN: 7 g/dL (ref 6.0–8.3)
Total Bilirubin: 0.5 mg/dL (ref 0.2–1.2)

## 2016-07-21 LAB — LIPID PANEL
CHOLESTEROL: 207 mg/dL — AB (ref 0–200)
HDL: 48.2 mg/dL (ref >39.00)
LDL Cholesterol: 122 mg/dL — ABNORMAL HIGH (ref 0–99)
NonHDL: 159.29
Total CHOL/HDL Ratio: 4
Triglycerides: 188 mg/dL — ABNORMAL HIGH (ref 0.0–149.0)
VLDL: 37.6 mg/dL (ref 0.0–40.0)

## 2016-07-21 LAB — MICROALBUMIN / CREATININE URINE RATIO
Creatinine,U: 199.9 mg/dL
MICROALB UR: 3.1 mg/dL — AB (ref 0.0–1.9)
MICROALB/CREAT RATIO: 1.6 mg/g (ref 0.0–30.0)

## 2016-07-21 LAB — HEMOGLOBIN A1C: HEMOGLOBIN A1C: 7.7 % — AB (ref 4.6–6.5)

## 2016-08-01 ENCOUNTER — Other Ambulatory Visit: Payer: Self-pay

## 2016-08-01 MED ORDER — LOSARTAN POTASSIUM 100 MG PO TABS: ORAL_TABLET | ORAL | 6 refills | Status: DC

## 2016-08-01 NOTE — Telephone Encounter (Signed)
Medication refill

## 2016-08-14 ENCOUNTER — Other Ambulatory Visit: Payer: Self-pay

## 2016-08-14 MED ORDER — METFORMIN HCL 500 MG PO TABS: ORAL_TABLET | ORAL | 4 refills | Status: DC

## 2016-08-14 NOTE — Telephone Encounter (Signed)
Medication RX updated.

## 2016-09-07 ENCOUNTER — Other Ambulatory Visit: Payer: Self-pay

## 2016-09-07 MED ORDER — METOPROLOL SUCCINATE ER 100 MG PO TB24: ORAL_TABLET | ORAL | 0 refills | Status: DC

## 2016-09-07 NOTE — Telephone Encounter (Signed)
Medication has been refilled.

## 2016-09-09 ENCOUNTER — Other Ambulatory Visit: Payer: Self-pay | Admitting: Family Medicine

## 2016-10-20 ENCOUNTER — Ambulatory Visit: Payer: BC Managed Care – PPO | Admitting: Internal Medicine

## 2016-11-20 ENCOUNTER — Encounter: Payer: Self-pay | Admitting: Family Medicine

## 2016-11-20 ENCOUNTER — Ambulatory Visit (INDEPENDENT_AMBULATORY_CARE_PROVIDER_SITE_OTHER): Payer: BC Managed Care – PPO | Admitting: Family Medicine

## 2016-11-20 VITALS — BP 141/80 | HR 74 | Temp 98.0°F | Resp 14 | Wt 184.4 lb

## 2016-11-20 DIAGNOSIS — I10 Essential (primary) hypertension: Secondary | ICD-10-CM | POA: Diagnosis not present

## 2016-11-20 DIAGNOSIS — E785 Hyperlipidemia, unspecified: Secondary | ICD-10-CM | POA: Diagnosis not present

## 2016-11-20 DIAGNOSIS — E1169 Type 2 diabetes mellitus with other specified complication: Secondary | ICD-10-CM

## 2016-11-20 DIAGNOSIS — E669 Obesity, unspecified: Secondary | ICD-10-CM

## 2016-11-20 MED ORDER — HYDROCHLOROTHIAZIDE 25 MG PO TABS: 25.0000 mg | ORAL_TABLET | Freq: Every day | ORAL | 3 refills | Status: DC

## 2016-11-20 MED ORDER — FLUTICASONE PROPIONATE 50 MCG/ACT NA SUSP: 2.0000 | Freq: Every day | NASAL | 6 refills | Status: DC

## 2016-11-20 MED ORDER — LEVOCETIRIZINE DIHYDROCHLORIDE 5 MG PO TABS: 5.0000 mg | ORAL_TABLET | Freq: Every evening | ORAL | 0 refills | Status: DC

## 2016-11-20 NOTE — Assessment & Plan Note (Signed)
Not at goal. Increasing HCTZ to 25 mg daily.

## 2016-11-20 NOTE — Assessment & Plan Note (Signed)
Uncontrolled. Discussed starting statin therapy. Patient declined. Lipid panel today.

## 2016-11-20 NOTE — Progress Notes (Signed)
Subjective:  Patient ID: Kathryn Massey, female    DOB: 1966/11/03  Age: 50 y.o. MRN: [id number]  CC: Follow up  HPI:  50 year old female with hypertension, hyperlipidemia, DM 2 presents for follow-up.  DM-2  Blood sugars readings - 120's.  Hypoglycemia - No.  Medications - Metformin 500 mg BID.  Adverse effects - No.  Compliance - Yes.  Preventative care  Eye exam - Up to date.  Foot exam - In need of.  Last A1C - In need of.  Urine microalbumin - Up to date.  HTN  BP elevated today.  Reports systolics 130-140 at home.  Compliant with losartan 100 mg, metoprolol 100 mg, and HCTZ 12.5.  HLD  Uncontrolled and untreated.  Will discuss today.  Social Hx   Social History   Social History  . Marital status: Married    Spouse name: N/A  . Number of children: N/A  . Years of education: N/A   Social History Main Topics  . Smoking status: Never Smoker  . Smokeless tobacco: Never Used  . Alcohol use Yes     Comment: occasionally  . Drug use: No  . Sexual activity: Not Asked   Other Topics Concern  . None   Social History Narrative   [place] in [place] with husband and daughter and son.     Work - [employer], nutritionist   Diet - Healthy   Exercise - walks 2-3 times per week.   Review of Systems  Constitutional: Negative.   Neurological:       No numbness/paresthesia of the feet.   Objective:  BP (!) 141/80 (BP Location: Left Arm, Patient Position: Sitting, Cuff Size: Large)   Pulse 74   Temp 98 F (36.7 C) (Oral)   Resp 14   Wt 184 lb 6 oz (83.6 kg)   LMP  (LMP Unknown)   SpO2 98%   BMI 32.66 kg/m   BP/Weight 11/20/2016 07/07/2016 06/23/2016  Systolic BP 141 158 156  Diastolic BP 80 80 90  Wt. (Lbs) 184.38 188.25 186  BMI 32.66 33.35 32.96   Physical Exam  Constitutional: She is oriented to person, place, and time. She appears well-developed. No distress.  Cardiovascular: Normal rate and regular rhythm.   Pulmonary/Chest:  Effort normal and breath sounds normal.  Abdominal: Soft. She exhibits no distension. There is no tenderness.  Neurological: She is alert and oriented to person, place, and time.  Psychiatric: She has a normal mood and affect.  Vitals reviewed. Diabetic Foot Check -  Appearance - no lesions, ulcers or calluses Skin - no unusual pallor or redness Monofilament testing -  Right - Great toe, medial, central, lateral ball and posterior foot intact Left - Great toe, medial, central, lateral ball and posterior foot intact   Lab Results  Component Value Date   WBC 8.6 05/26/2015   HGB 14.0 05/26/2015   HCT 43.0 05/26/2015   PLT 187 05/26/2015   GLUCOSE 155 (H) 07/21/2016   CHOL 207 (H) 07/21/2016   TRIG 188.0 (H) 07/21/2016   HDL 48.20 07/21/2016   LDLDIRECT 119.9 07/07/2014   LDLCALC 122 (H) 07/21/2016   ALT 34 07/21/2016   AST 27 07/21/2016   NA 140 07/21/2016   K 4.8 07/21/2016   CL 102 07/21/2016   CREATININE 0.72 07/21/2016   BUN 14 07/21/2016   CO2 30 07/21/2016   HGBA1C 7.7 (H) 07/21/2016   MICROALBUR 3.1 (H) 07/21/2016    Assessment & Plan:  Problem List Items Addressed This Visit    Hypertension (Chronic)    Not at goal. Increasing HCTZ to 25 mg daily.      Relevant Medications   hydrochlorothiazide (HYDRODIURIL) 25 MG tablet   Hyperlipidemia    Uncontrolled. Discussed starting statin therapy. Patient declined. Lipid panel today.      Relevant Medications   hydrochlorothiazide (HYDRODIURIL) 25 MG tablet   Diabetes mellitus type 2 in obese (HCC) - Primary (Chronic)    Well controlled per patients reported CBGs. A1C today. Continue metformin. Will increase pending A1C.      Relevant Orders   Hemoglobin A1c   Lipid panel     Meds ordered this encounter  Medications  . hydrochlorothiazide (HYDRODIURIL) 25 MG tablet    Sig: Take 1 tablet (25 mg total) by mouth daily.    Dispense:  90 tablet    Refill:  3   Follow-up: 3 months  Terek Bee Adriana Simasook  DO [facility] [place]

## 2016-11-20 NOTE — Assessment & Plan Note (Signed)
Well controlled per patients reported CBGs. A1C today. Continue metformin. Will increase pending A1C.

## 2016-11-20 NOTE — Progress Notes (Signed)
Pre visit review using our clinic review tool, if applicable. No additional management support is needed unless otherwise documented below in the visit note. 

## 2016-11-20 NOTE — Patient Instructions (Signed)
I increased your HCTZ.  Other meds are the same.  Consider cholesterol medication.  Take care  Follow up in 3 months  Dr. Adriana Simasook

## 2016-11-21 LAB — HEMOGLOBIN A1C: HEMOGLOBIN A1C: 7.7 % — AB (ref 4.6–6.5)

## 2016-11-21 LAB — LIPID PANEL
Cholesterol: 200 mg/dL (ref 0–200)
HDL: 47.6 mg/dL (ref >39.00)
NonHDL: 152.65
Total CHOL/HDL Ratio: 4
Triglycerides: 221 mg/dL — ABNORMAL HIGH (ref 0.0–149.0)
VLDL: 44.2 mg/dL — ABNORMAL HIGH (ref 0.0–40.0)

## 2016-11-21 LAB — LDL CHOLESTEROL, DIRECT: LDL DIRECT: 126 mg/dL

## 2016-11-22 ENCOUNTER — Other Ambulatory Visit: Payer: Self-pay | Admitting: Family Medicine

## 2016-11-22 MED ORDER — ATORVASTATIN CALCIUM 40 MG PO TABS: 40.0000 mg | ORAL_TABLET | Freq: Every day | ORAL | 3 refills | Status: DC

## 2016-12-11 ENCOUNTER — Other Ambulatory Visit: Payer: Self-pay | Admitting: Family

## 2016-12-15 NOTE — Telephone Encounter (Signed)
His medication was not mentioned in AVS on 11/2016. Please advise if you want pt to continue?

## 2016-12-15 NOTE — Telephone Encounter (Signed)
Pt called requesting a refill on this medication. She only has one pill left. Please advise, thank you!  Ph[phone] - [place], [place] - [address]  Call pt @ [phone] - please call when completed

## 2017-01-22 ENCOUNTER — Telehealth: Payer: Self-pay | Admitting: Family Medicine

## 2017-01-22 MED ORDER — METFORMIN HCL 1000 MG PO TABS: 1000.0000 mg | ORAL_TABLET | Freq: Two times a day (BID) | ORAL | 1 refills | Status: DC

## 2017-01-22 NOTE — Telephone Encounter (Signed)
Pt called about her medication of metFORMIN (GLUCOPHAGE) 500 MG tablet dosage was changed to 2000 mg pt states the pharmacy needs the new Rx to fill. Please and thank you!  [id number] [place], [place] - [address]  Call pt @ [phone].

## 2017-01-22 NOTE — Telephone Encounter (Signed)
New rx sent with corrected dose.

## 2017-02-11 ENCOUNTER — Ambulatory Visit
Admission: EM | Admit: 2017-02-11 | Discharge: 2017-02-11 | Disposition: A | Discharge status: Home / Self Care | Payer: BC Managed Care – PPO | Attending: Family Medicine | Admitting: Family Medicine

## 2017-02-11 ENCOUNTER — Encounter: Payer: Self-pay | Admitting: Emergency Medicine

## 2017-02-11 DIAGNOSIS — R059 Cough, unspecified: Secondary | ICD-10-CM

## 2017-02-11 DIAGNOSIS — H6501 Acute serous otitis media, right ear: Secondary | ICD-10-CM | POA: Diagnosis not present

## 2017-02-11 DIAGNOSIS — R05 Cough: Secondary | ICD-10-CM

## 2017-02-11 MED ORDER — AMOXICILLIN 875 MG PO TABS: 875.0000 mg | ORAL_TABLET | Freq: Two times a day (BID) | ORAL | 0 refills | Status: DC

## 2017-02-11 MED ORDER — HYDROCOD POLST-CPM POLST ER 10-8 MG/5ML PO SUER: 5.0000 mL | Freq: Two times a day (BID) | ORAL | 0 refills | Status: DC | PRN

## 2017-02-11 NOTE — ED Triage Notes (Signed)
Patient c/o pain in her right ear that started yesterday.  Patient c/o cough, congestion, runny nose and HAs that started last Wed. Patient denies fevers.

## 2017-02-11 NOTE — ED Provider Notes (Signed)
[facility]    CSN: [id number] Arrival date & time: 02/11/17  1306     History   Chief Complaint Chief Complaint  Patient presents with  . Cough  . Nasal Congestion    HPI Kathryn Massey is a 51 y.o. female.   The history is provided by the patient.  Cough  Associated symptoms: ear pain, fever, headaches and rhinorrhea   Associated symptoms: no wheezing   URI  Presenting symptoms: congestion, cough, ear pain, fever and rhinorrhea   Severity:  Moderate Onset quality:  Sudden Duration:  5 days Timing:  Constant Progression:  Worsening Chronicity:  New Relieved by:  Nothing Ineffective treatments:  OTC medications Associated symptoms: headaches   Associated symptoms: no sinus pain and no wheezing   Risk factors: diabetes mellitus and sick contacts   Risk factors: not elderly, no chronic cardiac disease, no chronic kidney disease, no chronic respiratory disease, no immunosuppression, no recent illness and no recent travel     Past Medical History:  Diagnosis Date  . Diabetes mellitus without complication (HCC)    borderline  . Hypertension     Patient Active Problem List   Diagnosis Date Noted  . Hyperlipidemia 11/20/2016  . Osteopenia 07/15/2015  . Chronic meniscal tear of knee 07/13/2014  . Diabetes mellitus type 2 in obese ([place]) 12/13/2012  . Hypertension 04/17/2012  . Obesity (BMI 30-39.9) 04/17/2012    Past Surgical History:  Procedure Laterality Date  . COLONOSCOPY WITH PROPOFOL N/A 09/27/2015   Procedure: COLONOSCOPY WITH PROPOFOL;  Surgeon: Lollie Sails, MD;  Location: [facility] ENDOSCOPY;  Service: Endoscopy;  Laterality: N/A;  . FOOT SURGERY     Dr. Vickki Muff    OB History    No data available       Home Medications    Prior to Admission medications   Medication Sig Start Date End Date Taking? Authorizing Provider  amoxicillin (AMOXIL) 875 MG tablet Take 1 tablet (875 mg total) by mouth 2 (two) times daily. 02/11/17    Norval Gable, MD  atorvastatin (LIPITOR) 40 MG tablet Take 1 tablet (40 mg total) by mouth daily. 11/22/16   Coral Spikes, DO  Blood Glucose Monitoring Suppl (ONE TOUCH ULTRA SYSTEM KIT) W/DEVICE KIT 1 kit by Does not apply route once. 11/21/12   Jackolyn Confer, MD  butalbital-acetaminophen-caffeine (FIORICET, ESGIC) 714 070 1264 MG tablet Take 1 tablet by mouth every 6 (six) hours as needed for headache. 06/23/16   Coral Spikes, DO  chlorpheniramine-HYDROcodone (TUSSIONEX PENNKINETIC ER) 10-8 MG/5ML SUER Take 5 mLs by mouth every 12 (twelve) hours as needed. 02/11/17   Norval Gable, MD  Diclofenac Sodium 2 % SOLN Apply twice daily. 07/13/14   Lyndal Pulley, DO  glucose blood (ONE TOUCH ULTRA TEST) test strip Use as instructed 07/07/16   Jackolyn Confer, MD  hydrochlorothiazide (HYDRODIURIL) 25 MG tablet Take 1 tablet (25 mg total) by mouth daily. 11/20/16   Coral Spikes, DO  losartan (COZAAR) 100 MG tablet TAKE 1 TABLET (100 MG TOTAL) BY MOUTH DAILY. 08/01/16   Burnard Hawthorne, FNP  metFORMIN (GLUCOPHAGE) 1000 MG tablet Take 1 tablet (1,000 mg total) by mouth 2 (two) times daily with a meal. 01/22/17   Coral Spikes, DO  metoprolol succinate (TOPROL-XL) 100 MG 24 hr tablet TAKE 1 TABLET BY MOUTH EVERY DAY (TAKE WITH OR IMMEDIATELY FOLLOWING A MEAL) 12/15/16   Coral Spikes, DO    Family History Family History  Problem Relation  Age of Onset  . Hypertension Mother   . Diabetes Mother   . COPD Father   . Heart disease Brother   . Diabetes Brother     Social History Social History  Substance Use Topics  . Smoking status: Never Smoker  . Smokeless tobacco: Never Used  . Alcohol use Yes     Comment: occasionally     Allergies   Patient has no known allergies.   Review of Systems Review of Systems  Constitutional: Positive for fever.  HENT: Positive for congestion, ear pain and rhinorrhea. Negative for sinus pain.   Respiratory: Positive for cough. Negative for wheezing.     Neurological: Positive for headaches.     Physical Exam Triage Vital Signs ED Triage Vitals  Enc Vitals Group     BP 02/11/17 1341 (!) 164/78     Pulse Rate 02/11/17 1341 73     Resp 02/11/17 1341 16     Temp 02/11/17 1341 (!) 100.5 F (38.1 C)     Temp Source 02/11/17 1341 Oral     SpO2 02/11/17 1341 98 %     Weight 02/11/17 1342 175 lb (79.4 kg)     Height 02/11/17 1342 5' 3" (1.6 m)     Head Circumference --      Peak Flow --      Pain Score 02/11/17 1343 8     Pain Loc --      Pain Edu? --      Excl. in [place]? --    No data found.   Updated Vital Signs BP (!) 164/78 (BP Location: Left Arm)   Pulse 73   Temp (!) 100.5 F (38.1 C) (Oral) Comment: Patient states that she took Tylenol around 12:00pm today  Resp 16   Ht 5' 3" (1.6 m)   Wt 175 lb (79.4 kg)   LMP  (LMP Unknown)   SpO2 98%   BMI 31.00 kg/m   Visual Acuity Right Eye Distance:   Left Eye Distance:   Bilateral Distance:    Right Eye Near:   Left Eye Near:    Bilateral Near:     Physical Exam  Constitutional: She appears well-developed and well-nourished. No distress.  HENT:  Head: Normocephalic and atraumatic.  Right Ear: External ear and ear canal normal. Tympanic membrane is erythematous and bulging. A middle ear effusion is present.  Left Ear: Tympanic membrane, external ear and ear canal normal.  Nose: Mucosal edema and rhinorrhea present. No nose lacerations, sinus tenderness, nasal deformity, septal deviation or nasal septal hematoma. No epistaxis.  No foreign bodies. Right sinus exhibits maxillary sinus tenderness and frontal sinus tenderness. Left sinus exhibits maxillary sinus tenderness and frontal sinus tenderness.  Mouth/Throat: Uvula is midline, oropharynx is clear and moist and mucous membranes are normal. No oropharyngeal exudate.  Eyes: Conjunctivae and EOM are normal. Pupils are equal, round, and reactive to light. Right eye exhibits no discharge. Left eye exhibits no discharge. No  scleral icterus.  Neck: Normal range of motion. Neck supple. No thyromegaly present.  Cardiovascular: Normal rate, regular rhythm and normal heart sounds.   Pulmonary/Chest: Effort normal and breath sounds normal. No respiratory distress. She has no wheezes. She has no rales.  Lymphadenopathy:    She has no cervical adenopathy.  Skin: She is not diaphoretic.  Nursing note and vitals reviewed.    UC Treatments / Results  Labs (all labs ordered are listed, but only abnormal results are displayed) Labs Reviewed - No  data to display  EKG  EKG Interpretation None       Radiology No results found.  Procedures Procedures (including critical care time)  Medications Ordered in UC Medications - No data to display   Initial Impression / Assessment and Plan / UC Course  I have reviewed the triage vital signs and the nursing notes.  Pertinent labs & imaging results that were available during my care of the patient were reviewed by me and considered in my medical decision making (see chart for details).       Final Clinical Impressions(s) / UC Diagnoses   Final diagnoses:  Right acute serous otitis media, recurrence not specified  Cough    New Prescriptions Discharge Medication List as of 02/11/2017  3:03 PM    START taking these medications   Details  amoxicillin (AMOXIL) 875 MG tablet Take 1 tablet (875 mg total) by mouth 2 (two) times daily., Starting Sun 02/11/2017, Normal    chlorpheniramine-HYDROcodone (TUSSIONEX PENNKINETIC ER) 10-8 MG/5ML SUER Take 5 mLs by mouth every 12 (twelve) hours as needed., Starting Sun 02/11/2017, Normal       1. diagnosis reviewed with patient 2. rx as per orders above; reviewed possible side effects, interactions, risks and benefits  3. Recommend supportive treatment with rest, fluids 4. Follow-up prn if symptoms worsen or don't improve   Norval Gable, MD 02/11/17 [phone]

## 2017-02-19 ENCOUNTER — Ambulatory Visit (INDEPENDENT_AMBULATORY_CARE_PROVIDER_SITE_OTHER): Payer: BC Managed Care – PPO | Admitting: Family Medicine

## 2017-02-19 ENCOUNTER — Encounter: Payer: Self-pay | Admitting: Family Medicine

## 2017-02-19 VITALS — BP 136/76 | HR 77 | Temp 98.3°F | Wt 179.1 lb

## 2017-02-19 DIAGNOSIS — E1169 Type 2 diabetes mellitus with other specified complication: Secondary | ICD-10-CM | POA: Diagnosis not present

## 2017-02-19 DIAGNOSIS — I10 Essential (primary) hypertension: Secondary | ICD-10-CM

## 2017-02-19 DIAGNOSIS — Z130 Encounter for screening for diseases of the blood and blood-forming organs and certain disorders involving the immune mechanism: Secondary | ICD-10-CM

## 2017-02-19 DIAGNOSIS — E785 Hyperlipidemia, unspecified: Secondary | ICD-10-CM

## 2017-02-19 DIAGNOSIS — E669 Obesity, unspecified: Secondary | ICD-10-CM

## 2017-02-19 NOTE — Patient Instructions (Signed)
Continue your meds.  Follow up in 6 months.  Take care  Dr. Marlane Hirschmann  

## 2017-02-19 NOTE — Progress Notes (Signed)
Subjective:  Patient ID: Kathryn Massey, female    DOB: 1966/06/15  Age: 51 y.o. MRN: [id number]  CC: Follow up  HPI:  51 year old female with HTN, DM-2, HLD presents for follow up.  DM-2  Blood sugars - 120's-140's per report.  Compliant with Metformin.  Reports diarrhea with metformin (tolerable).  HTN  BP stable on HCTZ, Losartan, Metoprolol.  HLD  Needs recheck.  Currently on Lipitor.   Social Hx   Social History   Social History  . Marital status: Married    Spouse name: N/A  . Number of children: N/A  . Years of education: N/A   Social History Main Topics  . Smoking status: Never Smoker  . Smokeless tobacco: Never Used  . Alcohol use Yes     Comment: occasionally  . Drug use: No  . Sexual activity: Not Asked   Other Topics Concern  . None   Social History Narrative   Lives in [place] with husband and daughter and son.     Work - [employer], nutritionist   Diet - Healthy   Exercise - walks 2-3 times per week.    Review of Systems  HENT:       Recent URI.  Gastrointestinal: Positive for diarrhea.    Objective:  BP 136/76 (BP Location: Left Arm, Cuff Size: Normal)   Pulse 77   Temp 98.3 F (36.8 C) (Oral)   Wt 179 lb 2 oz (81.3 kg)   LMP  (LMP Unknown)   SpO2 97%   BMI 31.73 kg/m   BP/Weight 02/19/2017 02/11/2017 11/20/2016  Systolic BP 136 164 141  Diastolic BP 76 78 80  Wt. (Lbs) 179.13 175 184.38  BMI 31.73 31 32.66    Physical Exam  Constitutional: She is oriented to person, place, and time. She appears well-developed. No distress.  Cardiovascular: Normal rate and regular rhythm.   Pulmonary/Chest: Effort normal and breath sounds normal.  Neurological: She is alert and oriented to person, place, and time.  Psychiatric: She has a normal mood and affect.  Vitals reviewed.   Lab Results  Component Value Date   WBC 8.6 05/26/2015   HGB 14.0 05/26/2015   HCT 43.0 05/26/2015   PLT 187 05/26/2015   GLUCOSE 155  (H) 07/21/2016   CHOL 200 11/20/2016   TRIG 221.0 (H) 11/20/2016   HDL 47.60 11/20/2016   LDLDIRECT 126.0 11/20/2016   LDLCALC 122 (H) 07/21/2016   ALT 34 07/21/2016   AST 27 07/21/2016   NA 140 07/21/2016   K 4.8 07/21/2016   CL 102 07/21/2016   CREATININE 0.72 07/21/2016   BUN 14 07/21/2016   CO2 30 07/21/2016   HGBA1C 7.7 (H) 11/20/2016   MICROALBUR 3.1 (H) 07/21/2016    Assessment & Plan:   Problem List Items Addressed This Visit    Hypertension (Chronic)    Stable. Continue HCTZ, Metoprolol and Losartan.      Diabetes mellitus type 2 in obese (HCC) - Primary (Chronic)    Sugars well controlled per report. A1C today. Continue metformin.      Relevant Orders   Hemoglobin A1c   Comprehensive metabolic panel   Hyperlipidemia    Lipid panel today. Continue Lipitor.      Relevant Orders   Lipid panel    Other Visit Diagnoses    Screening for deficiency anemia       Relevant Orders   CBC      Follow-up: Return in about 6  months (around 08/22/2017).  Everlene OtherJayce San Lohmeyer DO [facility] [place]

## 2017-02-19 NOTE — Assessment & Plan Note (Signed)
Sugars well controlled per report. A1C today. Continue metformin.

## 2017-02-19 NOTE — Assessment & Plan Note (Signed)
Lipid panel today. Continue Lipitor. 

## 2017-02-19 NOTE — Assessment & Plan Note (Signed)
Stable. Continue HCTZ, Metoprolol and Losartan.

## 2017-02-19 NOTE — Progress Notes (Signed)
Pre visit review using our clinic review tool, if applicable. No additional management support is needed unless otherwise documented below in the visit note. 

## 2017-02-20 LAB — COMPREHENSIVE METABOLIC PANEL
ALBUMIN: 4.3 g/dL (ref 3.5–5.2)
ALK PHOS: 57 U/L (ref 39–117)
ALT: 29 U/L (ref 0–35)
AST: 22 U/L (ref 0–37)
BILIRUBIN TOTAL: 0.4 mg/dL (ref 0.2–1.2)
BUN: 11 mg/dL (ref 6–23)
CO2: 31 mEq/L (ref 19–32)
Calcium: 9.7 mg/dL (ref 8.4–10.5)
Chloride: 102 mEq/L (ref 96–112)
Creatinine, Ser: 0.69 mg/dL (ref 0.40–1.20)
GFR: 94.89 mL/min (ref >60.00)
Glucose, Bld: 81 mg/dL (ref 70–99)
POTASSIUM: 4.5 meq/L (ref 3.5–5.1)
SODIUM: 141 meq/L (ref 135–145)
TOTAL PROTEIN: 6.9 g/dL (ref 6.0–8.3)

## 2017-02-20 LAB — CBC
HEMATOCRIT: 39 % (ref 36.0–46.0)
HEMOGLOBIN: 13.1 g/dL (ref 12.0–15.0)
MCHC: 33.6 g/dL (ref 30.0–36.0)
MCV: 92.4 fl (ref 78.0–100.0)
Platelets: 203 10*3/uL (ref 150.0–400.0)
RBC: 4.22 Mil/uL (ref 3.87–5.11)
RDW: 13.5 % (ref 11.5–15.5)
WBC: 8.1 10*3/uL (ref 4.0–10.5)

## 2017-02-20 LAB — LIPID PANEL
CHOLESTEROL: 116 mg/dL (ref 0–200)
HDL: 41.8 mg/dL (ref >39.00)
LDL Cholesterol: 35 mg/dL (ref 0–99)
NONHDL: 74.08
Total CHOL/HDL Ratio: 3
Triglycerides: 196 mg/dL — ABNORMAL HIGH (ref 0.0–149.0)
VLDL: 39.2 mg/dL (ref 0.0–40.0)

## 2017-02-20 LAB — HEMOGLOBIN A1C: Hgb A1c MFr Bld: 7.4 % — ABNORMAL HIGH (ref 4.6–6.5)

## 2017-03-13 ENCOUNTER — Other Ambulatory Visit: Payer: Self-pay | Admitting: Family Medicine

## 2017-05-15 ENCOUNTER — Other Ambulatory Visit: Payer: Self-pay | Admitting: Family

## 2017-05-25 NOTE — Telephone Encounter (Signed)
Pharmacy called requesting this refill. Pt is just about out of medication. Please advise, thank you!  [phone][id number] - [place], [place] - [address]

## 2017-07-14 ENCOUNTER — Other Ambulatory Visit: Payer: Self-pay | Admitting: Family Medicine

## 2017-08-15 ENCOUNTER — Encounter: Payer: Self-pay | Admitting: Certified Nurse Midwife

## 2017-08-15 ENCOUNTER — Ambulatory Visit (INDEPENDENT_AMBULATORY_CARE_PROVIDER_SITE_OTHER): Payer: BC Managed Care – PPO | Admitting: Certified Nurse Midwife

## 2017-08-15 VITALS — BP 138/74 | HR 72 | Ht 63.0 in | Wt 178.0 lb

## 2017-08-15 DIAGNOSIS — Z01419 Encounter for gynecological examination (general) (routine) without abnormal findings: Secondary | ICD-10-CM | POA: Diagnosis not present

## 2017-08-15 DIAGNOSIS — Z124 Encounter for screening for malignant neoplasm of cervix: Secondary | ICD-10-CM

## 2017-08-15 DIAGNOSIS — Z1239 Encounter for other screening for malignant neoplasm of breast: Secondary | ICD-10-CM

## 2017-08-15 DIAGNOSIS — Z1231 Encounter for screening mammogram for malignant neoplasm of breast: Secondary | ICD-10-CM

## 2017-08-16 ENCOUNTER — Other Ambulatory Visit: Payer: Self-pay | Admitting: Certified Nurse Midwife

## 2017-08-16 DIAGNOSIS — Z1231 Encounter for screening mammogram for malignant neoplasm of breast: Secondary | ICD-10-CM

## 2017-08-16 LAB — IGP,RFX APTIMA HPV ALL PTH: PAP Smear Comment: 0

## 2017-08-16 NOTE — Progress Notes (Signed)
Gynecology Annual Exam  PCP: Coral Spikes, DO  Chief Complaint:  Chief Complaint  Patient presents with  . Gynecologic Exam    History of Present Illness: Kathryn Massey is a 51 y.o. [phone] who presents for her annual gyn exam. The patient has no complaints today.  Her menses are absent and she is postmenopausal since 2010. She has had more hot flashes this summer. SHe denies any vaginal spotting. Last pap smear: 07/12/2016, results were NIL   The patient is sexually active.  She does not have dyspareunia.  Since her last visit, she has had no significant changes in her health. Her oldest son is doing pharmacy residency in [place] and her daughter is at [employer] and is premed.  Her past medical history is remarkable for hypertension, hyperlipidemia, daibetes, and osteopenia.She also has a history of DVT which developed when she had a boot on her lower leg after foot surgery.  The patient does perform self breast exams. Her last mammogram was 07/05/2016, results were negative.   There is no family history of breast cancer.   There is no family history of ovarian cancer.   She had a colonoscopy  09/27/2015. Results: hyperplastic polyps. Next one due: 10 years.   The patient denies smoking.  She denies drinking.  She denies illegal drug use.  The patient reports exercising regularly. SHe does get adequate calcium in her diet. Dexa scan 07/05/2015 revealed mild osteopenia.   A recent cholesterol screen 2018 was normal except for mild triglyceride elevation.  The patient denies current symptoms of depression.    Review of Systems: Review of Systems  Constitutional: Negative for chills, fever and weight loss.  HENT: Negative for congestion, sinus pain and sore throat.   Eyes: Negative for blurred vision and pain.  Respiratory: Negative for hemoptysis, shortness of breath and wheezing.   Cardiovascular: Negative for chest pain, palpitations and leg swelling.    Gastrointestinal: Negative for abdominal pain, blood in stool, diarrhea, heartburn, nausea and vomiting.  Genitourinary: Negative for dysuria, frequency, hematuria and urgency.  Musculoskeletal: Negative for back pain, joint pain and myalgias.  Skin: Negative for itching and rash.  Neurological: Negative for dizziness, tingling and headaches.  Endo/Heme/Allergies: Negative for environmental allergies and polydipsia. Does not bruise/bleed easily.       Negative for hirsutism   Psychiatric/Behavioral: Negative for depression. The patient is not nervous/anxious and does not have insomnia.     Past Medical History:  Past Medical History:  Diagnosis Date  . Diabetes mellitus without complication (HCC)    borderline  . DVT femoral (deep venous thrombosis) with thrombophlebitis, left ([place]) 2004   had boot on left LE after foot surgery  . Hypertension   . Liver disorder 2010   mild liver enzyme elevation  . Osteopenia 2016   mild DEXA scores -1.4, -1.5    Past Surgical History:  Past Surgical History:  Procedure Laterality Date  . COLONOSCOPY  2010   benign polyps  . COLONOSCOPY WITH PROPOFOL N/A 09/27/2015   Procedure: COLONOSCOPY WITH PROPOFOL;  Surgeon: Lollie Sails, MD;  Location: [facility] ENDOSCOPY;  Service: Endoscopy;  Laterality: N/A;  . FOOT SURGERY Left 2004   Dr. Vickki Muff tendon release for plantar fasciitis  . PUBOVAGINAL SLING  2005   Dr. Amalia Hailey mesh sling for SVI    Family History:  Family History  Problem Relation Age of Onset  . Hypertension Mother   . Diabetes Mother   . COPD  Father   . Hypertension Father   . Cirrhosis Father   . Heart disease Brother 2       had bypass surgery  . Diabetes Brother   . Hypertension Brother   . Hyperlipidemia Sister   . Lung cancer Maternal Uncle   . Lung cancer Maternal Grandmother     Social History:  Social History   Social History  . Marital status: Married    Spouse name: Richard Personnel officer"  . Number of  children: 3  . 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 Smoking status: Never Smoker  . Smokeless tobacco: Never Used  . Alcohol use Yes     Comment: rarely  . Drug use: No  . Sexual activity: Yes    Partners: Male    Birth control/ protection: Post-menopausal   Other Topics Concern  . Not on file   Social History Narrative   Lives in [place] with husband and daughter and son.     Work - [employer], nutritionist   Diet - Healthy   Exercise - walks 2-3 times per week.    Allergies:  No Known Allergies  Medications: Prior to Admission medications   Medication Sig Start Date End Date Taking? Authorizing Provider  atorvastatin (LIPITOR) 40 MG tablet Take 1 tablet (40 mg total) by mouth daily. 11/22/16  Yes Cook, Jayce G, DO  Blood Glucose Monitoring Suppl (ONE TOUCH ULTRA SYSTEM KIT) W/DEVICE KIT 1 kit by Does not apply route once. 11/21/12  Yes Jackolyn Confer, MD  glucose blood (ONE TOUCH ULTRA TEST) test strip Use as instructed 07/07/16  Yes Jackolyn Confer, MD  hydrochlorothiazide (HYDRODIURIL) 25 MG tablet Take 1 tablet (25 mg total) by mouth daily. 11/20/16  Yes Cook, Jayce G, DO  losartan (COZAAR) 100 MG tablet TAKE 1 TABLET (100 MG TOTAL) BY MOUTH DAILY. 05/25/17  Yes Cook, Jayce G, DO  metFORMIN (GLUCOPHAGE) 1000 MG tablet TAKE 1 TABLET (1,000 MG TOTAL) BY MOUTH 2 (TWO) TIMES DAILY WITH A MEAL. 07/16/17  Yes Cook, Jayce G, DO  metoprolol succinate (TOPROL-XL) 100 MG 24 hr tablet TAKE 1 TABLET BY MOUTH EVERY DAY (TAKE WITH OR IMMEDIATELY FOLLOWING A MEAL) 03/14/17  Yes Coral Spikes, DO    Physical Exam Vitals: BP 138/74   Pulse 72   Ht _0  (1.6 m)   Wt 178 lb (80.7 kg)   LMP  (LMP Unknown)   BMI 31.53 kg/m   General: WF in NAD HEENT: normocephalic, anicteric Neck: no thyroid enlargement, no palpable nodules, no cervical lymphadenopathy  Pulmonary: No increased work of breathing,  CTAB Cardiovascular: RRR, without murmur  Breast: Breast symmetrical, no tenderness, no palpable nodules or masses, no skin or nipple retraction present, no nipple discharge.  No axillary, infraclavicular or supraclavicular lymphadenopathy. Abdomen: Soft, non-tender, non-distended.  Umbilicus without lesions.  No hepatomegaly or masses palpable. No evidence of hernia. Genitourinary:  External: Multiple epidermal cysts on labia majora.  Normal urethral meatus, normal Bartholin's and Skene's glands.    Vagina: Normal vaginal mucosa, no evidence of prolapse.    Cervix: Grossly normal in appearance, no bleeding, non-tender  Uterus: Anteverted, normal size, shape, and consistency, mobile, and non-tender  Adnexa: No adnexal masses, non-tender  Rectal: deferred  Lymphatic: no evidence of inguinal lymphadenopathy Extremities: no edema, erythema, or tenderness Neurologic: Grossly intact Psychiatric: mood appropriate, affect full     Assessment: 51  y.o. [id number] annual gyn exam  Plan:   1) Breast cancer screening - recommend monthly self breast exam and annual screening mammograms. Mammogram was ordered today. Patient to schedule mammogram at [facility]  2) Colon cancer screening-colonoscopy UTD  3) Cervical cancer screening - Pap was done. ASCCP guidelines and rational discussed.  Patient opts for yearly screening interval  4) Osteoporosis prevention: calcium and vitamin D3 requirements discussed. Also discussed the role of exercise in preventing osteoporosis  5) RTO in 1 year and prn  Dalia Heading, CNM

## 2017-08-18 ENCOUNTER — Encounter: Payer: Self-pay | Admitting: Certified Nurse Midwife

## 2017-08-22 ENCOUNTER — Ambulatory Visit
Admission: RE | Admit: 2017-08-22 | Discharge: 2017-08-22 | Disposition: A | Discharge status: Home / Self Care | Payer: BC Managed Care – PPO | Source: Ambulatory Visit | Attending: Certified Nurse Midwife | Admitting: Certified Nurse Midwife

## 2017-08-22 DIAGNOSIS — Z1231 Encounter for screening mammogram for malignant neoplasm of breast: Secondary | ICD-10-CM | POA: Diagnosis not present

## 2017-08-23 ENCOUNTER — Ambulatory Visit (INDEPENDENT_AMBULATORY_CARE_PROVIDER_SITE_OTHER): Payer: BC Managed Care – PPO | Admitting: Family Medicine

## 2017-08-23 ENCOUNTER — Encounter: Payer: Self-pay | Admitting: Family Medicine

## 2017-08-23 VITALS — BP 124/84 | HR 75 | Temp 99.1°F | Resp 16 | Wt 179.2 lb

## 2017-08-23 DIAGNOSIS — E785 Hyperlipidemia, unspecified: Secondary | ICD-10-CM | POA: Diagnosis not present

## 2017-08-23 DIAGNOSIS — E1169 Type 2 diabetes mellitus with other specified complication: Secondary | ICD-10-CM | POA: Diagnosis not present

## 2017-08-23 DIAGNOSIS — I10 Essential (primary) hypertension: Secondary | ICD-10-CM

## 2017-08-23 DIAGNOSIS — J020 Streptococcal pharyngitis: Secondary | ICD-10-CM | POA: Diagnosis not present

## 2017-08-23 DIAGNOSIS — E669 Obesity, unspecified: Secondary | ICD-10-CM | POA: Diagnosis not present

## 2017-08-23 LAB — POCT RAPID STREP A (OFFICE): RAPID STREP A SCREEN: POSITIVE — AB

## 2017-08-23 LAB — POCT GLYCOSYLATED HEMOGLOBIN (HGB A1C): Hemoglobin A1C: 7.3

## 2017-08-23 MED ORDER — AMOXICILLIN 500 MG PO CAPS: 500.0000 mg | ORAL_CAPSULE | Freq: Two times a day (BID) | ORAL | 0 refills | Status: DC

## 2017-08-23 NOTE — Progress Notes (Signed)
Subjective:  Patient ID: Kathryn Massey, female    DOB: Mar 06, 1966  Age: 51 y.o. MRN: [id number]  CC: Follow up; Sore throat  HPI:  51 year old female with type 2 diabetes, hypertension, hyperlipidemia presents for follow-up. She complains of recent sore throat.  DM-2  Not checking blood sugars regularly.  Compliant with Metformin.  HTN  Stable on Losartan, HCTZ, and Metoprolol.  HLD  At gaol on Lipitor.  Sore throat  Started yesterday.  Moderate in severity.  Associated headache.  Using OTC Chloraseptic spray without improve.   No fever.  No known exacerbating or relieving factors.   Social Hx   Social History   Social History  . Marital status: Married    Spouse name: Richard Psychologist, occupational"  . Number of children: 3  . 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 Smoking status: Never Smoker  . Smokeless tobacco: Never Used  . Alcohol use Yes     Comment: rarely  . Drug use: No  . Sexual activity: Yes    Partners: Male    Birth control/ protection: Post-menopausal   Other Topics Concern  . None   Social History Narrative   Lives in [place] with husband and daughter and son.     Work - [employer], nutritionist   Diet - Healthy   Exercise - walks 2-3 times per week.    Review of Systems  Constitutional: Negative for fever.  HENT: Positive for sore throat.   Endocrine: Negative.     Objective:  BP 124/84 (BP Location: Left Arm, Patient Position: Sitting, Cuff Size: Normal)   Pulse 75   Temp 99.1 F (37.3 C) (Oral)   Resp 16   Wt 179 lb 4 oz (81.3 kg)   LMP  (LMP Unknown)   SpO2 97%   BMI 31.75 kg/m   BP/Weight 08/23/2017 08/15/2017 02/19/2017  Systolic BP 124 138 136  Diastolic BP 84 74 76  Wt. (Lbs) 179.25 178 179.13  BMI 31.75 31.53 31.73   Physical Exam  Constitutional: She is oriented to person, place, and time. She appears well-developed. No distress.    HENT:  Head: Normocephalic and atraumatic.  Mild oropharyngeal erythema.  Cardiovascular: Normal rate and regular rhythm.   No murmur heard. Pulmonary/Chest: Effort normal. She has no wheezes. She has no rales.  Neurological: She is alert and oriented to person, place, and time.  Psychiatric: She has a normal mood and affect.  Vitals reviewed.   Lab Results  Component Value Date   WBC 8.1 02/19/2017   HGB 13.1 02/19/2017   HCT 39.0 02/19/2017   PLT 203.0 02/19/2017   GLUCOSE 81 02/19/2017   CHOL 116 02/19/2017   TRIG 196.0 (H) 02/19/2017   HDL 41.80 02/19/2017   LDLDIRECT 126.0 11/20/2016   LDLCALC 35 02/19/2017   ALT 29 02/19/2017   AST 22 02/19/2017   NA 141 02/19/2017   K 4.5 02/19/2017   CL 102 02/19/2017   CREATININE 0.69 02/19/2017   BUN 11 02/19/2017   CO2 31 02/19/2017   HGBA1C 7.3 08/23/2017   MICROALBUR 3.1 (H) 07/21/2016    Assessment & Plan:   Problem List Items Addressed This Visit    Diabetes mellitus type 2 in obese (HCC) - Primary (Chronic)    A1C 7.3.  Recommend addition of Jardiance. Patient to consider. Continue Metformin.      Relevant Orders   POCT glycosylated  hemoglobin (Hb A1C) (Completed)   Hyperlipidemia    At goal on Lipitor. Continue.      Hypertension (Chronic)    Stable on Losartan/HCTZ and Metoprolol.      Pharyngitis due to Streptococcus species    Rapid strep positive. Treating with Amox.      Relevant Orders   POCT rapid strep A (Completed)      Meds ordered this encounter  Medications  . amoxicillin (AMOXIL) 500 MG capsule    Sig: Take 1 capsule (500 mg total) by mouth 2 (two) times daily.    Dispense:  20 capsule    Refill:  0   Follow-up: 3 months  Danella Philson Adriana Simas DO [facility]

## 2017-08-23 NOTE — Patient Instructions (Signed)
Continue your current medications.  Consider addition of Jardiance.  Follow up in 3 months.   Take care  Dr. Adriana Simas

## 2017-08-26 DIAGNOSIS — J020 Streptococcal pharyngitis: Secondary | ICD-10-CM | POA: Insufficient documentation

## 2017-08-26 NOTE — Assessment & Plan Note (Signed)
At goal on Lipitor. Continue. 

## 2017-08-26 NOTE — Assessment & Plan Note (Signed)
Rapid strep positive. Treating with Amox.

## 2017-08-26 NOTE — Assessment & Plan Note (Signed)
Stable on Losartan/HCTZ and Metoprolol.

## 2017-08-26 NOTE — Assessment & Plan Note (Signed)
A1C 7.3.  Recommend addition of Jardiance. Patient to consider. Continue Metformin.

## 2017-08-30 ENCOUNTER — Telehealth: Payer: Self-pay | Admitting: *Deleted

## 2017-08-30 NOTE — Telephone Encounter (Signed)
Patient was tested positive for strep throat. She has 2 doses of antibiotic left and has now developed clogged ears , congestion and possible sinus infection . Pt requested a call to discuss another medication. Pt contact [phone]

## 2017-08-30 NOTE — Telephone Encounter (Signed)
Please advise, thanks.

## 2017-11-06 ENCOUNTER — Ambulatory Visit: Payer: BC Managed Care – PPO | Admitting: Family Medicine

## 2017-11-06 ENCOUNTER — Other Ambulatory Visit: Payer: Self-pay

## 2017-11-06 ENCOUNTER — Encounter: Payer: Self-pay | Admitting: Family Medicine

## 2017-11-06 VITALS — BP 140/72 | HR 79 | Temp 98.1°F | Wt 174.6 lb

## 2017-11-06 DIAGNOSIS — E1169 Type 2 diabetes mellitus with other specified complication: Secondary | ICD-10-CM

## 2017-11-06 DIAGNOSIS — R197 Diarrhea, unspecified: Secondary | ICD-10-CM | POA: Diagnosis not present

## 2017-11-06 DIAGNOSIS — I10 Essential (primary) hypertension: Secondary | ICD-10-CM | POA: Diagnosis not present

## 2017-11-06 DIAGNOSIS — E669 Obesity, unspecified: Secondary | ICD-10-CM

## 2017-11-06 MED ORDER — METFORMIN HCL ER 500 MG PO TB24: 1000.0000 mg | ORAL_TABLET | Freq: Two times a day (BID) | ORAL | 2 refills | Status: DC

## 2017-11-06 NOTE — Progress Notes (Signed)
  Kathryn AlarEric Kemarion Abbey, MD Phone: [phone]  Kathryn RubensteinDeborah Lynne Massey is a 51 y.o. female who presents today for follow-up.  Patient notes stomach upset and diarrhea intermittently since going up on metformin.  Also occurs if she eats certain things.  Particularly greasy foods.  No abdominal pain, blood in her stool, nausea, or vomiting.  Notes will occur 1-2 times a week and she will have 3 or so bowel movements that are just pure water.  Has been going on for 6+ months.  Worsened with increased dose of metformin.  Diabetes: 100-140.  Taking metformin.  No polyuria or polydipsia.  No hypoglycemia.  Hypertension: Not checking at home.  Has been well controlled previously.  Taking HCTZ, losartan, metoprolol.  No chest pain, shortness of breath, or edema.  Social History   Tobacco Use  Smoking Status Never Smoker  Smokeless Tobacco Never Used     ROS see history of present illness  Objective  Physical Exam Vitals:   11/06/17 1551  BP: 140/72  Pulse: 79  Temp: 98.1 F (36.7 C)  SpO2: 96%    BP Readings from Last 3 Encounters:  11/06/17 140/72  08/23/17 124/84  08/15/17 138/74   Wt Readings from Last 3 Encounters:  11/06/17 174 lb 9.6 oz (79.2 kg)  08/23/17 179 lb 4 oz (81.3 kg)  08/15/17 178 lb (80.7 kg)    Physical Exam  Constitutional: No distress.  Cardiovascular: Normal rate, regular rhythm and normal heart sounds.  Pulmonary/Chest: Effort normal and breath sounds normal.  Abdominal: Soft. Bowel sounds are normal. She exhibits no distension. There is no tenderness. There is no rebound and no guarding.  Musculoskeletal: She exhibits no edema.  Neurological: She is alert. Gait normal.  Skin: Skin is warm and dry. She is not diaphoretic.     Assessment/Plan: Please see individual problem list.  Hypertension Borderline.  Has been previously well controlled.  She will start checking at home.  She will keep her follow-up later this month for recheck.  Diabetes  mellitus type 2 in obese [facility](HCC) Patient hesitant to start on additional medication.  We will transition her to extended release metformin.  She will work on diet and exercise.  She will return as scheduled later this month.  Diarrhea Alternating with normal stools.  Suspect related to metformin or food intolerance.  Benign abdominal exam.  Will change metformin to extended release.  Given foods to limit.  If not improving consider further evaluation   Kathryn Massey was seen today for diarrhea.  Diagnoses and all orders for this visit:  Essential hypertension -     Basic Metabolic Panel (BMET)  Diabetes mellitus type 2 in obese (HCC)  Diarrhea, unspecified type  Other orders -     metFORMIN (GLUCOPHAGE-XR) 500 MG 24 hr tablet; Take 2 tablets (1,000 mg total) by mouth 2 (two) times daily with a meal.    Orders Placed This Encounter  Procedures  . Basic Metabolic Panel (BMET)    Meds ordered this encounter  Medications  . metFORMIN (GLUCOPHAGE-XR) 500 MG 24 hr tablet    Sig: Take 2 tablets (1,000 mg total) by mouth 2 (two) times daily with a meal.    Dispense:  360 tablet    Refill:  2     Kathryn AlarEric Rosita Guzzetta, MD [facility]

## 2017-11-06 NOTE — Assessment & Plan Note (Signed)
Borderline.  Has been previously well controlled.  She will start checking at home.  She will keep her follow-up later this month for recheck.

## 2017-11-06 NOTE — Assessment & Plan Note (Signed)
Alternating with normal stools.  Suspect related to metformin or food intolerance.  Benign abdominal exam.  Will change metformin to extended release.  Given foods to limit.  If not improving consider further evaluation

## 2017-11-06 NOTE — Patient Instructions (Signed)
Nice to see you. We will change your metformin. Please start checking your blood pressure at home. We will get lab work.  Please look at the dietary information and see if there are any foods that you eat frequently and try to cut down on those to see if that helps with your stools.

## 2017-11-06 NOTE — Assessment & Plan Note (Signed)
Patient hesitant to start on additional medication.  We will transition her to extended release metformin.  She will work on diet and exercise.  She will return as scheduled later this month.

## 2017-11-07 LAB — BASIC METABOLIC PANEL
BUN: 14 mg/dL (ref 6–23)
CALCIUM: 9.8 mg/dL (ref 8.4–10.5)
CO2: 29 mEq/L (ref 19–32)
Chloride: 99 mEq/L (ref 96–112)
Creatinine, Ser: 0.69 mg/dL (ref 0.40–1.20)
GFR: 94.63 mL/min (ref >60.00)
GLUCOSE: 121 mg/dL — AB (ref 70–99)
Potassium: 4.2 mEq/L (ref 3.5–5.1)
SODIUM: 139 meq/L (ref 135–145)

## 2017-11-10 ENCOUNTER — Other Ambulatory Visit: Payer: Self-pay | Admitting: Family Medicine

## 2017-11-17 ENCOUNTER — Other Ambulatory Visit: Payer: Self-pay | Admitting: Family Medicine

## 2017-11-22 ENCOUNTER — Ambulatory Visit: Payer: BC Managed Care – PPO | Admitting: Family Medicine

## 2017-11-22 ENCOUNTER — Encounter: Payer: Self-pay | Admitting: Family Medicine

## 2017-11-22 VITALS — BP 150/80 | HR 67 | Temp 98.0°F | Wt 175.8 lb

## 2017-11-22 DIAGNOSIS — R519 Headache, unspecified: Secondary | ICD-10-CM

## 2017-11-22 DIAGNOSIS — E1169 Type 2 diabetes mellitus with other specified complication: Secondary | ICD-10-CM

## 2017-11-22 DIAGNOSIS — R51 Headache: Secondary | ICD-10-CM | POA: Diagnosis not present

## 2017-11-22 DIAGNOSIS — I10 Essential (primary) hypertension: Secondary | ICD-10-CM | POA: Diagnosis not present

## 2017-11-22 DIAGNOSIS — E669 Obesity, unspecified: Secondary | ICD-10-CM

## 2017-11-22 LAB — POCT GLYCOSYLATED HEMOGLOBIN (HGB A1C): Hemoglobin A1C: 7.2

## 2017-11-22 MED ORDER — AMLODIPINE BESYLATE 5 MG PO TABS: 5.0000 mg | ORAL_TABLET | Freq: Every day | ORAL | 3 refills | Status: DC

## 2017-11-22 NOTE — Patient Instructions (Signed)
Nice to see you. Please work on diet and exercise. Please monitor your blood pressure.  We will add amlodipine.  We will see you back in 1 month for recheck of blood pressure with nursing.   We will get you set up for an MRI to evaluate for a cause of your headaches. If your headaches worsen or you develop vision changes, numbness, or weakness please seek medical attention immediately.

## 2017-11-22 NOTE — Progress Notes (Signed)
Kathryn AlarEric Kathryn Kittleson, MD Phone: [phone]  Kathryn Massey is a 51 y.o. female who presents today for follow-up.  Hypertension: Typically running between 130 and into the 140s over 70s.  Takes losartan, HCTZ, and metoprolol.  No chest pain, shortness of breath, or edema.  Diabetes: She checked her sugar this morning and it was 128.  She has done much better on the extended release metformin.  Has not noticed really any diarrhea.  No polyuria or polydipsia.  No hypoglycemia.  She tries to watch what she eats and has been walking the dog for 15-20 minutes a day.  She wants to increase this and increase her activity in hopes of getting off some of her medication.  Patient reports increasing frequency of tension type headaches in the frontal region though also has some maxillary sinus discomfort as well.  Notes several weeks ago she did have what seemed to be a migraine with aura in her left eye with seeing a kaleidoscope of colors.  She had discomfort around her left eye though this resolved with Tylenol.  She has never had a headache like that previously.  Her headaches in the past were tension-like though have increased in frequency.  She notes no other neurological symptoms.  Social History   Tobacco Use  Smoking Status Never Smoker  Smokeless Tobacco Never Used     ROS see history of present illness  Objective  Physical Exam Vitals:   11/22/17 0956  BP: (!) 150/80  Pulse: 67  Temp: 98 F (36.7 C)  SpO2: 96%    BP Readings from Last 3 Encounters:  11/22/17 (!) 150/80  11/06/17 140/72  08/23/17 124/84   Wt Readings from Last 3 Encounters:  11/22/17 175 lb 12.8 oz (79.7 kg)  11/06/17 174 lb 9.6 oz (79.2 kg)  08/23/17 179 lb 4 oz (81.3 kg)    Physical Exam  Constitutional: No distress.  HENT:  Head: Normocephalic and atraumatic.  Mouth/Throat: No oropharyngeal exudate.  Mild postnasal drip noted  Eyes: Conjunctivae are normal. Pupils are equal, round, and reactive  to light.  Neck: Neck supple.  Cardiovascular: Normal rate, regular rhythm and normal heart sounds.  Pulmonary/Chest: Effort normal and breath sounds normal.  Musculoskeletal: She exhibits no edema.  Lymphadenopathy:    She has no cervical adenopathy.  Neurological: She is alert. Gait normal.  CN 2-12 intact, 5/5 strength in bilateral biceps, triceps, grip, quads, hamstrings, plantar and dorsiflexion, sensation to light touch intact in bilateral UE and LE, normal finger to nose, normal rapid alternating movements  Skin: Skin is warm and dry. She is not diaphoretic.  No temporal tenderness on the left   Assessment/Plan: Please see individual problem list.  Hypertension Above goal.  We will add low-dose amlodipine.  Continue other medications.  She will return in 1 month for BP check with nursing.  Diabetes mellitus type 2 in obese (HCC) Tolerating metformin extended release.  Will check an A1c.  New onset of headaches after age 51 Patient with mixed picture for headaches.  Tension-like headaches have been occurring more frequently.  She noted a headache that sounds like a migraine with aura versus an ocular migraine and left frontal discomfort.  Vision checked and normal.  She is neurologically intact.  Given increasing frequency of headaches and new type of headache with her age we discussed imaging versus neurology referral.  Patient opted for imaging.  MRI brain ordered.  Advised to follow-up with her ophthalmologist as well.  Return precautions given in  AVS.  Obesity (BMI 30-39.9) Encouraged diet and exercise.   Kathryn Massey was seen today for follow-up.  Diagnoses and all orders for this visit:  New onset of headaches after age 51 -     MR Brain Wo Contrast; Future  Diabetes mellitus type 2 in obese (HCC) -     POCT HgB A1C  Essential hypertension  Obesity (BMI 30-39.9)  Other orders -     amLODipine (NORVASC) 5 MG tablet; Take 1 tablet (5 mg total) by mouth  daily.    Orders Placed This Encounter  Procedures  . MR Brain Wo Contrast    Standing Status:   Future    Standing Expiration Date:   01/23/2019    Order Specific Question:   What is the patient's sedation requirement?    Answer:   No Sedation    Order Specific Question:   Does the patient have a pacemaker or implanted devices?    Answer:   No    Order Specific Question:   Preferred imaging location?    Answer:   [facility] (table limit-300lbs)    Order Specific Question:   Radiology Contrast Protocol - do NOT remove file path    Answer:   [email or web address]  . POCT HgB A1C    Meds ordered this encounter  Medications  . amLODipine (NORVASC) 5 MG tablet    Sig: Take 1 tablet (5 mg total) by mouth daily.    Dispense:  90 tablet    Refill:  3     Kathryn AlarEric Eliabeth Shoff, MD [facility]

## 2017-11-22 NOTE — Assessment & Plan Note (Signed)
Patient with mixed picture for headaches.  Tension-like headaches have been occurring more frequently.  She noted a headache that sounds like a migraine with aura versus an ocular migraine and left frontal discomfort.  Vision checked and normal.  She is neurologically intact.  Given increasing frequency of headaches and new type of headache with her age we discussed imaging versus neurology referral.  Patient opted for imaging.  MRI brain ordered.  Advised to follow-up with her ophthalmologist as well.  Return precautions given in AVS.

## 2017-11-22 NOTE — Assessment & Plan Note (Signed)
Tolerating metformin extended release.  Will check an A1c.

## 2017-11-22 NOTE — Assessment & Plan Note (Signed)
Encouraged diet and exercise.  

## 2017-11-22 NOTE — Assessment & Plan Note (Addendum)
Above goal.  We will add low-dose amlodipine.  Continue other medications.  She will return in 1 month for BP check with nursing.

## 2017-12-03 ENCOUNTER — Ambulatory Visit: Payer: BC Managed Care – PPO

## 2017-12-11 LAB — HM DIABETES EYE EXAM

## 2017-12-13 ENCOUNTER — Other Ambulatory Visit: Payer: Self-pay

## 2017-12-13 MED ORDER — LOSARTAN POTASSIUM 100 MG PO TABS: ORAL_TABLET | ORAL | 1 refills | Status: DC

## 2017-12-13 NOTE — Telephone Encounter (Signed)
Last OV 11/06/17 last filled by Dr.Cook 05/25/17 30 6rf

## 2017-12-25 ENCOUNTER — Ambulatory Visit: Payer: BC Managed Care – PPO

## 2018-01-10 ENCOUNTER — Other Ambulatory Visit: Payer: Self-pay | Admitting: Family Medicine

## 2018-02-20 ENCOUNTER — Ambulatory Visit: Payer: BC Managed Care – PPO | Admitting: Family Medicine

## 2018-02-20 ENCOUNTER — Encounter: Payer: Self-pay | Admitting: Family Medicine

## 2018-02-20 VITALS — BP 146/78 | HR 68 | Temp 98.5°F | Ht 63.0 in | Wt 176.4 lb

## 2018-02-20 DIAGNOSIS — R51 Headache: Secondary | ICD-10-CM | POA: Diagnosis not present

## 2018-02-20 DIAGNOSIS — Z23 Encounter for immunization: Secondary | ICD-10-CM | POA: Diagnosis not present

## 2018-02-20 DIAGNOSIS — I10 Essential (primary) hypertension: Secondary | ICD-10-CM | POA: Diagnosis not present

## 2018-02-20 DIAGNOSIS — E785 Hyperlipidemia, unspecified: Secondary | ICD-10-CM

## 2018-02-20 DIAGNOSIS — R519 Headache, unspecified: Secondary | ICD-10-CM

## 2018-02-20 DIAGNOSIS — E1169 Type 2 diabetes mellitus with other specified complication: Secondary | ICD-10-CM | POA: Diagnosis not present

## 2018-02-20 DIAGNOSIS — E669 Obesity, unspecified: Secondary | ICD-10-CM | POA: Diagnosis not present

## 2018-02-20 LAB — POCT GLYCOSYLATED HEMOGLOBIN (HGB A1C): Hemoglobin A1C: 6.8

## 2018-02-20 NOTE — Progress Notes (Signed)
Tommi Rumps, MD Phone: [phone]  Kathryn Massey is a 52 y.o. female who presents today for f/u.  HYPERTENSION  Disease Monitoring  Home BP Monitoring 120s-130/60s-70s since adding amlodipine Chest pain- no    Dyspnea- no Medications  Compliance-  Taking HCTZ, losartan, metoprolol, amlodipine.   Edema- no  DIABETES Disease Monitoring: Blood Sugar ranges-109-130 Polyuria/phagia/dipsia- no      Optho- UTD Medications: Compliance- taking metformin xr Hypoglycemic symptoms- rare if gets active and does not eat, improves with food  Headaches: She did not go through with MRI given cost concerns.  She has not had any recurrent headaches.  She did see her ophthalmologist and they advised that she had an ocular migraine.  They advised against MRI unless she had much more frequent headaches.  She notes no numbness or weakness.  No headache since I last saw her.   Social History   Tobacco Use  Smoking Status Never Smoker  Smokeless Tobacco Never Used     ROS see history of present illness  Objective  Physical Exam Vitals:   02/20/18 1005  BP: (!) 146/78  Pulse: 68  Temp: 98.5 F (36.9 C)  SpO2: 98%    BP Readings from Last 3 Encounters:  02/20/18 (!) 146/78  11/22/17 (!) 150/80  11/06/17 140/72   Wt Readings from Last 3 Encounters:  02/20/18 176 lb 6.4 oz (80 kg)  11/22/17 175 lb 12.8 oz (79.7 kg)  11/06/17 174 lb 9.6 oz (79.2 kg)    Physical Exam  Constitutional: No distress.  Cardiovascular: Normal rate, regular rhythm and normal heart sounds.  Pulmonary/Chest: Effort normal and breath sounds normal.  Musculoskeletal: She exhibits no edema.  Neurological: She is alert. Gait normal.  CN 2-12 intact, 5/5 strength in bilateral biceps, triceps, grip, quads, hamstrings, plantar and dorsiflexion, sensation to light touch intact in bilateral UE and LE  Skin: Skin is warm and dry. She is not diaphoretic.   Diabetic Foot Exam - Simple   Simple Foot  Form Diabetic Foot exam was performed with the following findings:  Yes 02/20/2018 10:19 AM  Visual Inspection See comments:  Yes Sensation Testing Intact to touch and monofilament testing bilaterally:  Yes Pulse Check Posterior Tibialis and Dorsalis pulse intact bilaterally:  Yes Comments 2 calluses on the ball of the left foot with no signs of infection, otherwise no deformities, ulcerations, or skin breakdown      Assessment/Plan: Please see individual problem list.  Hypertension Control has improved with amlodipine.  She will continue her current regimen.  Diabetes mellitus type 2 in obese (HCC) Check A1c.  Continue metformin.  Continue dietary changes as she has cut out soda and has increased water intake.  New onset of headaches after age 89 She has not had any recurrence.  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  Ophthalmology apparently advised against MRI which I think is reasonable given that she has had no recurrence.  If recurs she will let us know.  Hyperlipidemia She is due for repeat.  She will return for fasting labs.   Health Maintenance: Tdap given today.  Orders Placed This Encounter  Procedures  . Tdap vaccine greater than or equal to 7yo IM  . Lipid panel    Standing Status:   Future    Standing Expiration Date:   02/21/2019  . Comp Met (CMET)    Standing Status:   Future    Standing Expiration Date:   02/21/2019  . POCT HgB A1C    No orders  of the defined types were placed in this encounter.    Tommi Rumps, MD [place]

## 2018-02-20 NOTE — Assessment & Plan Note (Signed)
She is due for repeat.  She will return for fasting labs.

## 2018-02-20 NOTE — Progress Notes (Signed)
Pre visit review using our clinic review tool, if applicable. No additional management support is needed unless otherwise documented below in the visit note. 

## 2018-02-20 NOTE — Assessment & Plan Note (Signed)
Control has improved with amlodipine.  She will continue her current regimen.

## 2018-02-20 NOTE — Assessment & Plan Note (Signed)
She has not had any recurrence.  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  Ophthalmology apparently advised against MRI which I think is reasonable given that she has had no recurrence.  If recurs she will let us know.

## 2018-02-20 NOTE — Assessment & Plan Note (Signed)
Check A1c.  Continue metformin.  Continue dietary changes as she has cut out soda and has increased water intake.

## 2018-02-20 NOTE — Patient Instructions (Signed)
Nice to see you. Please monitor for recurrence of headaches and if they do recur please let us know. We will have you return for fasting labs sometime in the next couple of weeks.

## 2018-02-24 ENCOUNTER — Other Ambulatory Visit: Payer: Self-pay | Admitting: Family Medicine

## 2018-03-06 ENCOUNTER — Other Ambulatory Visit (INDEPENDENT_AMBULATORY_CARE_PROVIDER_SITE_OTHER): Payer: BC Managed Care – PPO

## 2018-03-06 DIAGNOSIS — E1169 Type 2 diabetes mellitus with other specified complication: Secondary | ICD-10-CM | POA: Diagnosis not present

## 2018-03-06 DIAGNOSIS — E785 Hyperlipidemia, unspecified: Secondary | ICD-10-CM

## 2018-03-06 DIAGNOSIS — E669 Obesity, unspecified: Secondary | ICD-10-CM | POA: Diagnosis not present

## 2018-03-06 LAB — COMPREHENSIVE METABOLIC PANEL
ALBUMIN: 4.3 g/dL (ref 3.5–5.2)
ALK PHOS: 58 U/L (ref 39–117)
ALT: 27 U/L (ref 0–35)
AST: 26 U/L (ref 0–37)
BUN: 16 mg/dL (ref 6–23)
CO2: 29 mEq/L (ref 19–32)
Calcium: 10 mg/dL (ref 8.4–10.5)
Chloride: 101 mEq/L (ref 96–112)
Creatinine, Ser: 0.56 mg/dL (ref 0.40–1.20)
GFR: 120.25 mL/min (ref >60.00)
GLUCOSE: 143 mg/dL — AB (ref 70–99)
POTASSIUM: 5.1 meq/L (ref 3.5–5.1)
SODIUM: 141 meq/L (ref 135–145)
TOTAL PROTEIN: 7.2 g/dL (ref 6.0–8.3)
Total Bilirubin: 0.5 mg/dL (ref 0.2–1.2)

## 2018-03-06 LAB — LIPID PANEL
CHOLESTEROL: 111 mg/dL (ref 0–200)
HDL: 47 mg/dL (ref >39.00)
LDL Cholesterol: 47 mg/dL (ref 0–99)
NonHDL: 63.8
Total CHOL/HDL Ratio: 2
Triglycerides: 84 mg/dL (ref 0.0–149.0)
VLDL: 16.8 mg/dL (ref 0.0–40.0)

## 2018-05-03 ENCOUNTER — Encounter: Payer: Self-pay | Admitting: Certified Nurse Midwife

## 2018-05-03 ENCOUNTER — Ambulatory Visit: Payer: BC Managed Care – PPO | Admitting: Certified Nurse Midwife

## 2018-05-03 ENCOUNTER — Telehealth: Payer: Self-pay | Admitting: Certified Nurse Midwife

## 2018-05-03 VITALS — BP 138/78 | HR 72 | Ht 63.0 in | Wt 175.0 lb

## 2018-05-03 DIAGNOSIS — I809 Phlebitis and thrombophlebitis of unspecified site: Secondary | ICD-10-CM

## 2018-05-03 NOTE — Telephone Encounter (Signed)
Patient is aware of first available appointment at [facility] on Monday, 05/13/18 @ 3:20pm and that she can call each day to check for cancellations. [place]'s phone# given.

## 2018-05-03 NOTE — Telephone Encounter (Signed)
-----   Message from [place], [place] sent at 05/03/2018 10:51 AM EDT ----- Regarding: referral for diagnostic mammogram [place] please schedule for a diagnostic right mammogram +/- an ultrasound for a superficial phlebitis starting under right breast extending caudally about 10 cm. Looking for breast mass that may explain this phlebitis. No masses on breast exam. Last mammogram at [facility] in 08/22/2018 was normal.

## 2018-05-03 NOTE — Progress Notes (Signed)
Obstetrics & Gynecology Office Visit   Chief Complaint:  Chief Complaint  Patient presents with  . Breast Problem    right breast problem    History of Present Illness: Kathryn Massey is a 52 yo G3 P3 postmenopausal WF who presents with complaints of a non tender cord palpable under right breast x a couple of weeks. She denies any injury to this area. She has not palpated any breast masses. Last mammogram was 08/22/17 was negative with scattered fibroglandular densities. Her history is remarkable for hypertension, hypercholesterolemia and diabetes. She also has a history of a DVT in her left leg following a period of immobility (had a boot on her left LE after foot surgery).   Review of Systems:  Review of Systems  Constitutional: Negative for chills, fever and weight loss.  HENT: Negative for congestion, sinus pain and sore throat.   Eyes: Negative for blurred vision and pain.  Respiratory: Negative for hemoptysis, shortness of breath and wheezing.   Cardiovascular: Negative for chest pain, palpitations and leg swelling.  Gastrointestinal: Negative for abdominal pain, blood in stool, diarrhea, heartburn, nausea and vomiting.  Genitourinary: Negative for dysuria, frequency, hematuria and urgency.  Musculoskeletal: Negative for back pain, joint pain and myalgias.  Skin: Negative for itching and rash.       Positive for a cord under her right breast, negative for inflammation or tenderness.  Neurological: Negative for dizziness, tingling and headaches.  Endo/Heme/Allergies: Negative for environmental allergies and polydipsia. Does not bruise/bleed easily.       Negative for hirsutism   Psychiatric/Behavioral: Negative for depression. The patient is not nervous/anxious and does not have insomnia.      Past Medical History:  Past Medical History:  Diagnosis Date  . Diabetes mellitus without complication (HCC)    borderline  . DVT femoral (deep venous thrombosis) with  thrombophlebitis, left ([place]) 2004   had boot on left LE after foot surgery  . Hypertension   . Liver disorder 2010   mild liver enzyme elevation  . Osteopenia 2016   mild DEXA scores -1.4, -1.5    Past Surgical History:  Past Surgical History:  Procedure Laterality Date  . COLONOSCOPY  2010   benign polyps  . COLONOSCOPY WITH PROPOFOL N/A 09/27/2015   Procedure: COLONOSCOPY WITH PROPOFOL;  Surgeon: Lollie Sails, MD;  Location: [facility] ENDOSCOPY;  Service: Endoscopy;  Laterality: N/A;  . FOOT SURGERY Left 2004   Dr. Vickki Muff tendon release for plantar fasciitis  . PUBOVAGINAL SLING  2005   Dr. Amalia Hailey mesh sling for SVI    Gynecologic History: No LMP recorded (lmp unknown). Patient is postmenopausal.  Obstetric History: [id number]  Family History:  Family History  Problem Relation Age of Onset  . Hypertension Mother   . Diabetes Mother   . COPD Father   . Hypertension Father   . Cirrhosis Father   . Heart disease Brother 75       had bypass surgery  . Diabetes Brother   . Hypertension Brother   . Hyperlipidemia Sister   . Lung cancer Maternal Uncle   . Lung cancer Maternal Grandmother     Social History:  Social History   Socioeconomic History  . Marital status: Married    Spouse name: Richard Personnel officer"  . Number of children: 3  . Years of education: Not on file  . Highest education level: Not on file  Occupational History  . 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 Financial  resource strain: Not on file  . Food insecurity:    Worry: Not on file    Inability: Not on file  . Transportation needs:    Medical: Not on file    Non-medical: Not on file  Tobacco Use  . Smoking status: Never Smoker  . Smokeless tobacco: Never Used  Substance and Sexual Activity  . Alcohol use: Yes    Comment: rarely  . Drug use: No  . Sexual activity: Yes    Partners: Male    Birth control/protection: Post-menopausal  Lifestyle  . Physical activity:    Days per  week: 5 days    Minutes per session: 30 min  . Stress: Not at all  Relationships  . Social connections:    Talks on phone: Not on file    Gets together: Not on file    Attends religious service: Not on file    Active member of club or organization: Not on file    Attends meetings of clubs or organizations: Not on file    Relationship status: Not on file  . Intimate partner violence:    Fear of current or ex partner: Not on file    Emotionally abused: Not on file    Physically abused: Not on file    Forced sexual activity: Not on file  Other Topics Concern  . Not on file  Social History Narrative   Lives in [place] with husband and daughter and son.     Work - [employer], nutritionist   Diet - Healthy   Exercise - walks 2-3 times per week.    Allergies:  No Known Allergies  Medications: Prior to Admission medications   Medication Sig Start Date End Date Taking? Authorizing Provider  amLODipine (NORVASC) 5 MG tablet Take 1 tablet (5 mg total) by mouth daily. 11/22/17  Yes Leone Haven, MD  atorvastatin (LIPITOR) 40 MG tablet TAKE 1 TABLET (40 MG TOTAL) BY MOUTH DAILY. 11/14/17  Yes Leone Haven, MD  Blood Glucose Monitoring Suppl (ONE TOUCH ULTRA SYSTEM KIT) W/DEVICE KIT 1 kit by Does not apply route once. 11/21/12  Yes Jackolyn Confer, MD  glucose blood (ONE TOUCH ULTRA TEST) test strip Use as instructed 07/07/16  Yes Jackolyn Confer, MD  hydrochlorothiazide (HYDRODIURIL) 25 MG tablet TAKE 1 TABLET (25 MG TOTAL) BY MOUTH DAILY. 11/19/17  Yes Leone Haven, MD  losartan (COZAAR) 100 MG tablet TAKE 1 TABLET (100 MG TOTAL) BY MOUTH DAILY. 12/13/17  Yes Leone Haven, MD  metFORMIN (GLUCOPHAGE-XR) 500 MG 24 hr tablet Take 2 tablets (1,000 mg total) by mouth 2 (two) times daily with a meal. 11/06/17  Yes Leone Haven, MD  metoprolol succinate (TOPROL-XL) 100 MG 24 hr tablet TAKE 1 TABLET BY MOUTH EVERY DAY (TAKE WITH OR IMMEDIATELY FOLLOWING A MEAL)  02/25/18  Yes Leone Haven, MD    Physical Exam Vitals:BP 138/78   Pulse 72   Ht _0  (1.6 m)   Wt 175 lb (79.4 kg)   LMP  (LMP Unknown)   BMI 31.00 kg/m  Physical Exam  Constitutional: She is oriented to person, place, and time. She appears well-developed and well-nourished. No distress.  Cardiovascular: Normal rate.  Respiratory: Effort normal.    Firm cord/vein palpated under right breast extending from the base of the breast caudally x about 10 cm, non tender to palpation.  Breasts: symmetrical, no skin or nipple changes, no inflammation, no masses in breasts; no axillary,  supraclavicular or infraclavicular lymphadenopathy.  Neurological: She is alert and oriented to person, place, and time.  Psychiatric: Her behavior is normal. Her mood appears anxious.     Assessment: 52 y.o. [id number] with superficial phlebitis of the chest wall ?etiology  Plan: Consulted with 3 physicians at [facility]. Per their recommendations will start on ASA 325 mgm daily and encouraged patient to apply heat to the area. Get diagnostic right mammogram +/- an ultrasound Can refer to vascular doctor or see her PCP-patient would like to see PCP first.   Dalia Heading, CNM

## 2018-05-06 ENCOUNTER — Other Ambulatory Visit: Payer: Self-pay | Admitting: Family Medicine

## 2018-05-06 NOTE — Progress Notes (Signed)
Subjective:    Patient ID: Kathryn Massey, female    DOB: 27-Jun-1966, 52 y.o.   MRN: [id number]  HPI  Kathryn Massey is a 52 year old female who is here today with a nontender palpable cord under right breast that started approximately 2 to 3 weeks ago. She noted this after getting out of the shower and drying off with a towel. She has not tried anything at home for this at this time. She denies any changes to this area since she noticed the cord and also after her visit for evaluation on 05/03/18 with OBGYN.  Denies pain, tenderness, warmth, erythema, fever, chills, sweats, breast mass, or injury to area.   History of DVT 2004  in left leg following immobilization after foot surgery while wearing a boot.  She was treated with warfarin for 6 months without adverse events per patient. She is not a smoker.  Last mammogram was negative on 08/22/17 and noted scattered fibroglandular densities. She was evaluated by OBGYN 05/03/18 who noted this finding as superficial phlebitis. She was recommended to start on ASA 325 mg daily, advised use of heat, and also scheduled for a diagnostic mammogram and [place] to assess for a breast mass that could be causing this symptom. She was also offered a vascular referral or advised follow up with PCP if patient preferred. She has not tried ASA or heat. She plans to follow up for diagnostic mammogram as scheduled.  Review of Systems  Constitutional: Negative for chills, fatigue and fever.  Respiratory: Negative for cough, shortness of breath and wheezing.   Cardiovascular: Negative for chest pain and palpitations.  Gastrointestinal: Negative for abdominal pain, constipation, diarrhea, nausea and vomiting.  Skin: Negative for color change and rash.       Cord or rope like area under right breast extending downward  Psychiatric/Behavioral:       Denies depression. Anxious due to uncertain diagnosis   Past Medical History:  Diagnosis Date  . Diabetes mellitus  without complication (HCC)    borderline  . DVT femoral (deep venous thrombosis) with thrombophlebitis, left ([place]) 2004   had boot on left LE after foot surgery  . Hypertension   . Liver disorder 2010   mild liver enzyme elevation  . Osteopenia 2016   mild DEXA scores -1.4, -1.5     Social History   Socioeconomic History  . Marital status: Married    Spouse name: Kathryn Massey Personnel officer"  . Number of children: 3  . Years of education: Not on file  . Highest education level: Not on file  Occupational History  . 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 Financial resource strain: Not on file  . Food insecurity:    Worry: Not on file    Inability: Not on file  . Transportation needs:    Medical: Not on file    Non-medical: Not on file  Tobacco Use  . Smoking status: Never Smoker  . Smokeless tobacco: Never Used  Substance and Sexual Activity  . Alcohol use: Yes    Comment: rarely  . Drug use: No  . Sexual activity: Yes    Partners: Male    Birth control/protection: Post-menopausal  Lifestyle  . Physical activity:    Days per week: 5 days    Minutes per session: 30 min  . Stress: Not at all  Relationships  . Social connections:    Talks on phone: Not on file    Gets together: Not on file  Attends religious service: Not on file    Active member of club or organization: Not on file    Attends meetings of clubs or organizations: Not on file    Relationship status: Not on file  . Intimate partner violence:    Fear of current or ex partner: Not on file    Emotionally abused: Not on file    Physically abused: Not on file    Forced sexual activity: Not on file  Other Topics Concern  . Not on file  Social History Narrative   Lives in [place] with husband and daughter and son.     Work - [employer], nutritionist   Diet - Healthy   Exercise - walks 2-3 times per week.    Past Surgical History:  Procedure Laterality Date  . COLONOSCOPY  2010   benign  polyps  . COLONOSCOPY WITH PROPOFOL N/A 09/27/2015   Procedure: COLONOSCOPY WITH PROPOFOL;  Surgeon: Lollie Sails, MD;  Location: [facility] ENDOSCOPY;  Service: Endoscopy;  Laterality: N/A;  . FOOT SURGERY Left 2004   Dr. Vickki Muff tendon release for plantar fasciitis  . PUBOVAGINAL SLING  2005   Dr. Amalia Hailey mesh sling for SVI    Family History  Problem Relation Age of Onset  . Hypertension Mother   . Diabetes Mother   . COPD Father   . Hypertension Father   . Cirrhosis Father   . Heart disease Brother 64       had bypass surgery  . Diabetes Brother   . Hypertension Brother   . Hyperlipidemia Sister   . Lung cancer Maternal Uncle   . Lung cancer Maternal Grandmother     No Known Allergies  Current Outpatient Medications on File Prior to Visit  Medication Sig Dispense Refill  . amLODipine (NORVASC) 5 MG tablet Take 1 tablet (5 mg total) by mouth daily. 90 tablet 3  . atorvastatin (LIPITOR) 40 MG tablet TAKE 1 TABLET (40 MG TOTAL) BY MOUTH DAILY. 90 tablet 3  . Blood Glucose Monitoring Suppl (ONE TOUCH ULTRA SYSTEM KIT) W/DEVICE KIT 1 kit by Does not apply route once. 1 each 0  . glucose blood (ONE TOUCH ULTRA TEST) test strip Use as instructed 100 each 12  . hydrochlorothiazide (HYDRODIURIL) 25 MG tablet TAKE 1 TABLET (25 MG TOTAL) BY MOUTH DAILY. 90 tablet 1  . losartan (COZAAR) 100 MG tablet TAKE 1 TABLET (100 MG TOTAL) BY MOUTH DAILY. 90 tablet 1  . metFORMIN (GLUCOPHAGE-XR) 500 MG 24 hr tablet TAKE 2 TABLETS (1,000 MG TOTAL) BY MOUTH 2 (TWO) TIMES DAILY WITH A MEAL. 360 tablet 2  . metoprolol succinate (TOPROL-XL) 100 MG 24 hr tablet TAKE 1 TABLET BY MOUTH EVERY DAY (TAKE WITH OR IMMEDIATELY FOLLOWING A MEAL) 90 tablet 3   No current facility-administered medications on file prior to visit.     BP 140/88   Pulse 71   Temp 98.5 F (36.9 C) (Oral)   Wt 175 lb 6 oz (79.5 kg)   LMP  (LMP Unknown)   SpO2 99%   BMI 31.07 kg/m       Objective:   Physical Exam    Constitutional: She is oriented to person, place, and time. She appears well-developed and well-nourished.  Eyes: Pupils are equal, round, and reactive to light. No scleral icterus.  Neck: Neck supple.  Cardiovascular: Normal rate, regular rhythm and normal heart sounds.  Pulmonary/Chest: Effort normal and breath sounds normal. She has no wheezes. She has no rales.  Abdominal: Soft. Bowel sounds are normal. There is no tenderness.  Musculoskeletal: She exhibits no edema.  Non tender palpable cord that appears to be a vessel at the base of her right breast extending downward. Palpated this cord approximately 8 to 9 cm. No erythema or warmth appreciated.   Lymphadenopathy:    She has no cervical adenopathy.  Neurological: She is alert and oriented to person, place, and time.  Skin: Skin is warm and dry. No erythema.  Psychiatric: She has a normal mood and affect. Her behavior is normal. Judgment and thought content normal.  Appears anxious       Assessment & Plan:  1. Superficial phlebitis Exam findings are most consistent with a vessel that is palpable, challenging to determine etiology. No symptoms of erythema, warmth, or tenderness present at this time. Advised patient to begin conservative treatment of ASA 325 mg daily, apply heat and obtain diagnostic mammogram and [place] as ordered by OBGYN to rule out any mass in the breast. She is also interested in vascular consult as this appears to be vascular in nature and she has a remote history of DVT following immobilization after foot surgery. Further evaluation and treatment will be determined following diagnostic mammogram and [place] scheduled in 6 days. Can consider further imaging of abdomen if indicated. Return precautions advised.  - Ambulatory referral to Vascular Surgery   Delano Metz, FNP-C

## 2018-05-07 ENCOUNTER — Ambulatory Visit: Payer: BC Managed Care – PPO | Admitting: Family Medicine

## 2018-05-07 ENCOUNTER — Encounter: Payer: Self-pay | Admitting: Family Medicine

## 2018-05-07 VITALS — BP 140/88 | HR 71 | Temp 98.5°F | Wt 175.4 lb

## 2018-05-07 DIAGNOSIS — I809 Phlebitis and thrombophlebitis of unspecified site: Secondary | ICD-10-CM

## 2018-05-07 NOTE — Patient Instructions (Signed)
Please start ASA 325 mg once daily.  Use heat and follow up for diagnostic mammogram scheduled next week.  After you complete your mammogram and ultrasound, if findings indicate that an abdominal ultrasound is indicated this will be ordered.  You will be contact about your referral.  Please let us know if you have not heard back within 1 week about your referral.    Phlebitis Phlebitis is soreness and puffiness (swelling) in a vein. Follow these instructions at home:  Only take medicine as told by your doctor.  Raise (elevate) the affected limb on a pillow as told by your doctor.  Keep a warm pack on the affected vein as told by your doctor. Do not sleep with a heating pad.  Use special stockings or bandages around the area of the affected vein as told by your doctor. These will speed healing and keep the condition from coming back.  Talk to your doctor about all the medicines you take.  Get follow-up blood tests as told by your doctor.  If the phlebitis is in your legs: ? Avoid standing or resting for long periods. ? Keep your legs moving. Raise your legs when you sit or lie.  Do not smoke.  Follow-up with your doctor as told. Contact a doctor if:  You have strange bruises or bleeding.  Your puffiness or pain in the affected area is not getting better.  You are taking medicine to lessen puffiness (anti-inflammatory medicine), and you get belly pain.  You have a fever. Get help right away if:  The phlebitis gets worse and you have more pain, puffiness (swelling), or redness.  You have trouble breathing or have chest pain. This information is not intended to replace advice given to you by your health care provider. Make sure you discuss any questions you have with your health care provider. Document Released: 11/08/2009 Document Revised: 04/27/2016 Document Reviewed: 07/28/2013 Elsevier Interactive Patient Education  2017 [employer]

## 2018-05-13 ENCOUNTER — Ambulatory Visit
Admission: RE | Admit: 2018-05-13 | Discharge: 2018-05-13 | Disposition: A | Discharge status: Home / Self Care | Payer: BC Managed Care – PPO | Source: Ambulatory Visit | Attending: Certified Nurse Midwife | Admitting: Certified Nurse Midwife

## 2018-05-13 DIAGNOSIS — I809 Phlebitis and thrombophlebitis of unspecified site: Secondary | ICD-10-CM

## 2018-05-14 ENCOUNTER — Telehealth: Payer: Self-pay | Admitting: Family Medicine

## 2018-05-14 ENCOUNTER — Telehealth: Payer: Self-pay | Admitting: Certified Nurse Midwife

## 2018-05-14 NOTE — Telephone Encounter (Signed)
Called and discussed diagnostic mammogram results. PAtient aware of results: negative. Her PCP put in referral to vascular, but vascular contacted her and said that they would not see her for this/ The vascular clinic said they would be contacting her PCP with a recommendation. I recommend seeing a surgeon like DR Lemar LivingsByrnett. Will see who vascular recommends. I advised patient to call her PCP. Advised patient to decrease dose of ASA to 81 mgm daily. Farrel Connersolleen Emmalin Jaquess, CNM

## 2018-05-14 NOTE — Telephone Encounter (Signed)
Please let the patient know that I saw her mammogram results as being negative.  She previously saw Raynelle FanningJulie for an abnormality just inferior to her breast.  I would like to see her in the office to recheck this to determine the next appropriate step in evaluation.  We can see her at 430 on a Monday, Wednesday, or Friday.  Or she could potentially see Raynelle FanningJulie again on Thursday for reevaluation.  Thanks.

## 2018-05-15 ENCOUNTER — Telehealth: Payer: Self-pay

## 2018-05-15 NOTE — Telephone Encounter (Signed)
° ° °  Pt made an appt to see Raynelle FanningJulie on 6/1/319

## 2018-05-15 NOTE — Telephone Encounter (Signed)
Tried calling, no voicemail. Ok for pec to speak to patient about message below and schedule appointment

## 2018-05-15 NOTE — Telephone Encounter (Signed)
fyi

## 2018-05-15 NOTE — Telephone Encounter (Signed)
error 

## 2018-05-16 ENCOUNTER — Encounter: Payer: Self-pay | Admitting: Family Medicine

## 2018-05-16 ENCOUNTER — Ambulatory Visit: Payer: BC Managed Care – PPO | Admitting: Family Medicine

## 2018-05-16 VITALS — BP 140/80 | HR 61 | Temp 98.2°F | Wt 175.0 lb

## 2018-05-16 DIAGNOSIS — I809 Phlebitis and thrombophlebitis of unspecified site: Secondary | ICD-10-CM

## 2018-05-16 NOTE — Patient Instructions (Signed)
You will be contact about your referral.  Please let us know if you have not heard back within 1 week about your referral.  Continue 81 mg aspirin with food.  If symptoms do not improve, worsen, or new symptoms develop, follow up for further evaluation.   Phlebitis Phlebitis is soreness and puffiness (swelling) in a vein. Follow these instructions at home:  Only take medicine as told by your doctor.  Raise (elevate) the affected limb on a pillow as told by your doctor.  Keep a warm pack on the affected vein as told by your doctor. Do not sleep with a heating pad.  Use special stockings or bandages around the area of the affected vein as told by your doctor. These will speed healing and keep the condition from coming back.  Talk to your doctor about all the medicines you take.  Get follow-up blood tests as told by your doctor.  If the phlebitis is in your legs: ? Avoid standing or resting for long periods. ? Keep your legs moving. Raise your legs when you sit or lie.  Do not smoke.  Follow-up with your doctor as told. Contact a doctor if:  You have strange bruises or bleeding.  Your puffiness or pain in the affected area is not getting better.  You are taking medicine to lessen puffiness (anti-inflammatory medicine), and you get belly pain.  You have a fever. Get help right away if:  The phlebitis gets worse and you have more pain, puffiness (swelling), or redness.  You have trouble breathing or have chest pain. This information is not intended to replace advice given to you by your health care provider. Make sure you discuss any questions you have with your health care provider. Document Released: 11/08/2009 Document Revised: 04/27/2016 Document Reviewed: 07/28/2013 Elsevier Interactive Patient Education  2017 [employer]

## 2018-05-16 NOTE — Progress Notes (Signed)
Subjective:    Patient ID: Kathryn Massey, female    DOB: 07-17-1966, 52 y.o.   MRN: [id number]  HPI  Kathryn Massey is a 52 year old female who presents today for a follow up of a non tender palpable cord under her right breast. Prior evaluation for this concern was on 05/03/18 with her gyn and also on 05/07/18 in this office.  Since those visits, she initiated daily ASA therapy and use of heat and completed a diagnostic mammogram and [place] that was ordered by gyn. Results were negative for breast malignancy or mammographic or sonographic evidence of superficial thrombophlebitis. Since this screening, the area has improved and she reports that she can "sometimes" feel the "cord" and other times it is not noticeable. She also reports that results were given to her by her gyn office and with improvement, patient reports that no further recommendations were made other than a follow up with PCP if symptoms persist.  Review of result notes from gyn office do include a recommendation to see a surgeon for further evaluation to determine etiology. Patient was also advised to decrease ASA to 81 mg daily which she is now doing.   Today, she denies pain, tenderness, warmth, erythema, fever, chills, or sweats. She reports feeling well overall and is relieved that her mammogram was negative and that she is noticing improvement after using heat.  Review of Systems  Constitutional: Negative for appetite change, chills, fatigue and fever.  Respiratory: Negative for cough, shortness of breath and wheezing.   Cardiovascular: Negative for chest pain and palpitations.  Gastrointestinal: Negative for abdominal pain, diarrhea, nausea and vomiting.  Musculoskeletal: Negative for myalgias.  Skin: Negative for rash.       Rope or cord like structure under right breast  Psychiatric/Behavioral:       Denies depressed or anxious mood today   Past Medical History:  Diagnosis Date  . Diabetes mellitus without  complication (HCC)    borderline  . DVT femoral (deep venous thrombosis) with thrombophlebitis, left ([place]) 2004   had boot on left LE after foot surgery  . Hypertension   . Liver disorder 2010   mild liver enzyme elevation  . Osteopenia 2016   mild DEXA scores -1.4, -1.5     Social History   Socioeconomic History  . Marital status: Married    Spouse name: Richard Personnel officer"  . Number of children: 3  . Years of education: Not on file  . Highest education level: Not on file  Occupational History  . 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 Financial resource strain: Not on file  . Food insecurity:    Worry: Not on file    Inability: Not on file  . Transportation needs:    Medical: Not on file    Non-medical: Not on file  Tobacco Use  . Smoking status: Never Smoker  . Smokeless tobacco: Never Used  Substance and Sexual Activity  . Alcohol use: Yes    Comment: rarely  . Drug use: No  . Sexual activity: Yes    Partners: Male    Birth control/protection: Post-menopausal  Lifestyle  . Physical activity:    Days per week: 5 days    Minutes per session: 30 min  . Stress: Not at all  Relationships  . Social connections:    Talks on phone: Not on file    Gets together: Not on file    Attends religious service: Not on file  Active member of club or organization: Not on file    Attends meetings of clubs or organizations: Not on file    Relationship status: Not on file  . Intimate partner violence:    Fear of current or ex partner: Not on file    Emotionally abused: Not on file    Physically abused: Not on file    Forced sexual activity: Not on file  Other Topics Concern  . Not on file  Social History Narrative   Lives in [place] with husband and daughter and son.     Work - [employer], nutritionist   Diet - Healthy   Exercise - walks 2-3 times per week.    Past Surgical History:  Procedure Laterality Date  . COLONOSCOPY  2010   benign polyps  .  COLONOSCOPY WITH PROPOFOL N/A 09/27/2015   Procedure: COLONOSCOPY WITH PROPOFOL;  Surgeon: Lollie Sails, MD;  [facility] END[facility]COPY;  Service: Endoscopy;  Laterality: N/A;  . FOOT SURGERY Left 2004   Dr. Vickki Muff tendon release for plantar fasciitis  . PUBOVAGINAL SLING  2005   Dr. Amalia Hailey mesh sling for SVI    Family History  Problem Relation Age of Onset  . Hypertension Mother   . Diabetes Mother   . COPD Father   . Hypertension Father   . Cirrhosis Father   . Heart disease Brother 63       had bypass surgery  . Diabetes Brother   . Hypertension Brother   . Hyperlipidemia Sister   . Lung cancer Maternal Uncle   . Lung cancer Maternal Grandmother     No Known Allergies  Current Outpatient Medications on File Prior to Visit  Medication Sig Dispense Refill  . amLODipine (NORVASC) 5 MG tablet Take 1 tablet (5 mg total) by mouth daily. 90 tablet 3  . atorvastatin (LIPITOR) 40 MG tablet TAKE 1 TABLET (40 MG TOTAL) BY MOUTH DAILY. 90 tablet 3  . Blood Glucose Monitoring Suppl (ONE TOUCH ULTRA SYSTEM KIT) W/DEVICE KIT 1 kit by Does not apply route once. 1 each 0  . glucose blood (ONE TOUCH ULTRA TEST) test strip Use as instructed 100 each 12  . hydrochlorothiazide (HYDRODIURIL) 25 MG tablet TAKE 1 TABLET (25 MG TOTAL) BY MOUTH DAILY. 90 tablet 1  . losartan (COZAAR) 100 MG tablet TAKE 1 TABLET (100 MG TOTAL) BY MOUTH DAILY. 90 tablet 1  . metFORMIN (GLUCOPHAGE-XR) 500 MG 24 hr tablet TAKE 2 TABLETS (1,000 MG TOTAL) BY MOUTH 2 (TWO) TIMES DAILY WITH A MEAL. 360 tablet 2  . metoprolol succinate (TOPROL-XL) 100 MG 24 hr tablet TAKE 1 TABLET BY MOUTH EVERY DAY (TAKE WITH OR IMMEDIATELY FOLLOWING A MEAL) 90 tablet 3   No current facility-administered medications on file prior to visit.     BP 140/80 (BP Location: Left Arm, Patient Position: Sitting, Cuff Size: Normal)   Pulse 61   Temp 98.2 F (36.8 C) (Oral)   Wt 175 lb (79.4 kg)   LMP  (LMP Unknown)   SpO2 96%   BMI 31.00  kg/m       Objective:   Physical Exam  Constitutional: She is oriented to person, place, and time. She appears well-developed and well-nourished.  Eyes: Pupils are equal, round, and reactive to light. No scleral icterus.  Neck: Neck supple.  Cardiovascular: Normal rate, regular rhythm and intact distal pulses.  Pulmonary/Chest: Effort normal and breath sounds normal. She has no wheezes. She has no rales.  Abdominal:  Soft. Bowel sounds are normal. There is no tenderness.  Musculoskeletal: She exhibits no edema.  Lymphadenopathy:    She has no cervical adenopathy.  Neurological: She is alert and oriented to person, place, and time.  Skin: Skin is warm and dry. No rash noted.  Non tender palpable area of fullness that feels like a vessel  at the base of her right breast extending downward which could only be palpated when sitting upright.  This area of fullness is approximately 2 to 3 cm and is much less noticeable than prior visit. Also, this has improved from the previous measurement of 8 to 9 cm at prior visit. No erythema or warmth appreciated.      Psychiatric: She has a normal mood and affect. Her behavior is normal. Judgment and thought content normal.       Assessment & Plan:  1. Superficial phlebitis Improved; exam findings are most consistent with phlebitis that has improved with application of heat. Mammogram and [place] of breast are unremarkable for symptom etiology. We discussed options for further evaluation including CT imaging, monitoring for continuing improvement or imaging if no improvement or worsening symptom, and also referral to general surgeon for further evaluation. Patient reports that she would like to see surgeon prior to imaging and this also is in alignment with recommendation from gyn.  Referral to surgeon placed and this was reviewed with PCP who is in agreement with this plan.  Advised decreasing ASA to 81 mg daily at this time and she will follow up with surgeon.  Further reviewed return precautions and she voiced understanding of close follow up.  - Ambulatory referral to [place], FNP-C

## 2018-05-17 ENCOUNTER — Other Ambulatory Visit: Payer: Self-pay | Admitting: Family Medicine

## 2018-06-11 ENCOUNTER — Other Ambulatory Visit: Payer: Self-pay | Admitting: Family Medicine

## 2018-08-23 ENCOUNTER — Ambulatory Visit: Payer: BC Managed Care – PPO | Admitting: Family Medicine

## 2018-08-27 ENCOUNTER — Other Ambulatory Visit (HOSPITAL_COMMUNITY)
Admission: RE | Admit: 2018-08-27 | Discharge: 2018-08-27 | Disposition: A | Discharge status: Home / Self Care | Payer: BC Managed Care – PPO | Source: Ambulatory Visit | Attending: Certified Nurse Midwife | Admitting: Certified Nurse Midwife

## 2018-08-27 ENCOUNTER — Encounter: Payer: Self-pay | Admitting: Certified Nurse Midwife

## 2018-08-27 ENCOUNTER — Ambulatory Visit (INDEPENDENT_AMBULATORY_CARE_PROVIDER_SITE_OTHER): Payer: BC Managed Care – PPO | Admitting: Certified Nurse Midwife

## 2018-08-27 VITALS — BP 134/80 | HR 66 | Ht 63.0 in | Wt 176.5 lb

## 2018-08-27 DIAGNOSIS — Z124 Encounter for screening for malignant neoplasm of cervix: Secondary | ICD-10-CM

## 2018-08-27 DIAGNOSIS — Z1239 Encounter for other screening for malignant neoplasm of breast: Secondary | ICD-10-CM

## 2018-08-27 DIAGNOSIS — Z01419 Encounter for gynecological examination (general) (routine) without abnormal findings: Secondary | ICD-10-CM | POA: Diagnosis present

## 2018-08-27 DIAGNOSIS — Z1231 Encounter for screening mammogram for malignant neoplasm of breast: Secondary | ICD-10-CM | POA: Diagnosis not present

## 2018-08-27 NOTE — Progress Notes (Signed)
Gynecology Annual Exam  PCP: Leone Haven, MD  Chief Complaint:  Chief Complaint  Patient presents with  . Gynecologic Exam    History of Present Illness: Kathryn Massey is a 52 y.o. [id number] who presents for her annual gyn exam. The patient has no complaints today.  Her menses are absent and she is postmenopausal since 2010.  She denies any vaginal spotting. Last pap smear: 08/15/2017, results were NIL   The patient is sexually active.  She does not have dyspareunia.  Since her last visit, she had an episode of superficial thrombophlebitis in her right inframammary area extending inferiorly. She had a negative right diagnostic mammogram and was placed on ASA. The phlebitis resolved in about 1 month.  Her oldest son is doing pharmacy residency in [place] and her daughter is at [employer] and is premed.  Her past medical history is remarkable for hypertension, hyperlipidemia, diabetes, and osteopenia.She also has a history of DVT which developed when she had a boot on her lower leg after foot surgery.  The patient does perform self breast exams. Her last bilateral screening mammogram was 08/22/2017 , results were negative.   There is no family history of breast cancer.   There is no family history of ovarian cancer.   She had a colonoscopy  09/27/2015. Results: hyperplastic polyps. Next one due: 10 years.   The patient denies smoking.  She denies drinking.  She denies illegal drug use.  The patient reports exercising regularly. She does get adequate calcium in her diet. Dexa scan 07/05/2015 revealed mild osteopenia.   A recent cholesterol screen 2019 was normal. Last hemoglobin A1C was done 02/2018 and was 6.8%.  The patient denies current symptoms of depression.    Review of Systems: Review of Systems  Constitutional: Negative for chills, fever and weight loss.  HENT: Positive for congestion and sore throat. Negative for sinus pain.   Eyes: Negative for  blurred vision and pain.  Respiratory: Negative for hemoptysis, shortness of breath and wheezing.   Cardiovascular: Negative for chest pain, palpitations and leg swelling.  Gastrointestinal: Negative for abdominal pain, blood in stool, diarrhea, heartburn, nausea and vomiting.  Genitourinary: Negative for dysuria, frequency, hematuria and urgency.  Musculoskeletal: Negative for back pain, joint pain and myalgias.       Positive for right foot pain  Skin: Negative for itching and rash.  Neurological: Negative for dizziness, tingling and headaches.  Endo/Heme/Allergies: Negative for environmental allergies and polydipsia. Does not bruise/bleed easily.       Negative for hirsutism   Psychiatric/Behavioral: Negative for depression. The patient is not nervous/anxious and does not have insomnia.     Past Medical History:  Past Medical History:  Diagnosis Date  . Diabetes mellitus without complication (HCC)    borderline  . DVT femoral (deep venous thrombosis) with thrombophlebitis, left ([place]) 2004   had boot on left LE after foot surgery  . Hypertension   . Liver disorder 2010   mild liver enzyme elevation  . Osteopenia 2016   mild DEXA scores -1.4, -1.5    Past Surgical History:  Past Surgical History:  Procedure Laterality Date  . COLONOSCOPY  2010   benign polyps  . COLONOSCOPY WITH PROPOFOL N/A 09/27/2015   Procedure: COLONOSCOPY WITH PROPOFOL;  Surgeon: Lollie Sails, MD;  Location[facility] END[facility]COPY;  Service: Endoscopy;  Laterality: N/A;  . FOOT SURGERY Left 2004   Dr. Vickki Muff tendon release for plantar fasciitis  . PUBOVAGINAL SLING  2005   Dr. Amalia Hailey mesh sling for SVI    Family History:  Family History  Problem Relation Age of Onset  . Hypertension Mother   . Diabetes Mother   . COPD Father   . Hypertension Father   . Cirrhosis Father 24       deceased  . Heart disease Brother 24       had bypass surgery  . Diabetes Brother   . Hypertension Brother   .  Hyperlipidemia Sister   . Lung cancer Maternal Uncle   . Lung cancer Maternal Grandmother     Social History:  Social History   Socioeconomic History  . Marital status: Married    Spouse name: Richard Personnel officer"  . Number of children: 3  . Years of education: 34  . Highest education level: Not on file  Occupational History  . 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 Financial resource strain: Not on file  . Food insecurity:    Worry: Not on file    Inability: Not on file  . Transportation needs:    Medical: Not on file    Non-medical: Not on file  Tobacco Use  . Smoking status: Never Smoker  . Smokeless tobacco: Never Used  Substance and Sexual Activity  . Alcohol use: Yes    Comment: rarely  . Drug use: No  . Sexual activity: Yes    Partners: Male    Birth control/protection: Post-menopausal  Lifestyle  . Physical activity:    Days per week: 5 days    Minutes per session: 30 min  . Stress: Not at all  Relationships  . Social connections:    Talks on phone: Not on file    Gets together: Not on file    Attends religious service: Not on file    Active member of club or organization: Not on file    Attends meetings of clubs or organizations: Not on file    Relationship status: Not on file  . Intimate partner violence:    Fear of current or ex partner: Not on file    Emotionally abused: Not on file    Physically abused: Not on file    Forced sexual activity: Not on file  Other Topics Concern  . Not on file  Social History Narrative   Lives in [place] with husband and daughter and son.     Work - [employer], nutritionist   Diet - Healthy   Exercise - walks 2-3 times per week.    Allergies:  No Known Allergies  Medications:  Current Outpatient Medications on File Prior to Visit  Medication Sig Dispense Refill  . amLODipine (NORVASC) 5 MG tablet Take 1 tablet (5 mg total) by mouth daily. 90 tablet 3  . atorvastatin (LIPITOR) 40 MG tablet  TAKE 1 TABLET (40 MG TOTAL) BY MOUTH DAILY. 90 tablet 3  . Blood Glucose Monitoring Suppl (ONE TOUCH ULTRA SYSTEM KIT) W/DEVICE KIT 1 kit by Does not apply route once. 1 each 0  . glucose blood (ONE TOUCH ULTRA TEST) test strip Use as instructed 100 each 12  . hydrochlorothiazide (HYDRODIURIL) 25 MG tablet TAKE 1 TABLET BY MOUTH EVERY DAY 90 tablet 1  . losartan (COZAAR) 100 MG tablet TAKE 1 TABLET BY MOUTH EVERY DAY 90 tablet 1  . metFORMIN (GLUCOPHAGE-XR) 500 MG 24 hr tablet TAKE 2 TABLETS (1,000 MG TOTAL) BY MOUTH 2 (TWO) TIMES DAILY WITH A MEAL. 360 tablet 2  . metoprolol  succinate (TOPROL-XL) 100 MG 24 hr tablet TAKE 1 TABLET BY MOUTH EVERY DAY (TAKE WITH OR IMMEDIATELY FOLLOWING A MEAL) 90 tablet 3   No current facility-administered medications on file prior to visit.     Physical Exam Vitals: BP 134/80   Pulse 66   Ht _0  (1.6 m)   Wt 80.1 kg   LMP  (LMP Unknown)   BMI 31.27 kg/m   General: WF in NAD HEENT: normocephalic, anicteric Neck: no thyroid enlargement, no palpable nodules, no cervical lymphadenopathy  Pulmonary: No increased work of breathing, CTAB Cardiovascular: RRR, without murmur  Breast: Breast symmetrical, no tenderness, no palpable nodules or masses, no skin or nipple retraction present, no nipple discharge.  No axillary, infraclavicular or supraclavicular lymphadenopathy. Abdomen: Soft, non-tender, non-distended.  Umbilicus without lesions.  No hepatomegaly or masses palpable. No evidence of hernia. Genitourinary:  External: Multiple epidermal cysts on labia majora.  Normal urethral meatus, normal Bartholin's and Skene's glands.    Vagina: Normal vaginal mucosa, no evidence of prolapse, multiparous introitus    Cervix: Grossly normal in appearance, no bleeding, non-tender  Uterus: Anteverted, normal size, shape, and consistency, mobile, and non-tender  Adnexa: No adnexal masses, non-tender  Rectal: deferred  Lymphatic: no evidence of inguinal  lymphadenopathy Extremities: no edema, erythema, or tenderness Neurologic: Grossly intact Psychiatric: mood appropriate, affect full Skin: 2-3 cm round intradermal lesion to right of spine in thoracic area (probable lipoma)     Assessment: 52 y.o. [id number] annual gyn exam  Plan:   1) Breast cancer screening - recommend monthly self breast exam and annual screening mammograms. Mammogram was ordered today. Patient to schedule mamm[facility] at [facility]  2) Colon cancer screening-colonoscopy UTD  3) Cervical cancer screening - Pap was done. ASCCP guidelines and rational discussed.  Patient opts for yearly screening interval  4) Osteoporosis prevention: calcium and vitamin D3 requirements discussed. Encouraged vitamin D3 supplementation 1000 IU daily. Eligible for DEXA scan and patient declines at this time.  5) RTO in 1 year and prn  Dalia Heading, CNM

## 2018-08-27 NOTE — Patient Instructions (Signed)
Preventing Osteoporosis, Adult Osteoporosis is a condition that causes the bones to get weaker. With osteoporosis, the bones become thinner, and the normal spaces in bone tissue become larger. This can make the bones weak and cause them to break more easily. People who have osteoporosis are more likely to break their wrist, spine, or hip. Even a minor accident or injury can be enough to break weak bones. Osteoporosis can occur with aging. Your body constantly replaces old bone tissue with new tissue. As you get older, you may lose bone tissue more quickly, or it may be replaced more slowly. Osteoporosis is more likely to develop if you have poor nutrition or do not get enough calcium or vitamin D. Other lifestyle factors can also play a role. By making some diet and lifestyle changes, you can help to keep your bones healthy and help to prevent osteoporosis. What nutrition changes can be made? Nutrition plays an important role in maintaining healthy, strong bones.  Make sure you get enough calcium every day from food or from calcium supplements. ? If you are age 53 or younger, aim to get 1,000 mg of calcium every day. ? If you are older than age 53, aim to get 1,200 mg of calcium every day.  Try to get enough vitamin D every day. ? Take 1000 IU daily  Follow a healthy diet. Eat plenty of foods that contain calcium and vitamin D. ? Calcium is in milk, cheese, yogurt, and other dairy products. Some fish and vegetables are also good sources of calcium. Many foods such as cereals and breads have had calcium added to them (are fortified). Check nutrition labels to see how much calcium is in a food or drink. ? Foods that contain vitamin D include milk, cereals, salmon, and tuna. Your body also makes vitamin D when you are out in the sun. Bare skin exposure to the sun on your face, arms, legs, or back for no more than 30 minutes a day, 2 times per week is more than enough. Beyond that, it is important to use  sunblock to protect your skin from sunburn, which increases your risk for skin cancer.  What lifestyle changes can be made? Making changes in your everyday life can also play an important role in preventing osteoporosis.  Stay active and get exercise every day. Ask your health care provider what types of exercise are best for you.  Do not use any products that contain nicotine or tobacco, such as cigarettes and e-cigarettes. If you need help quitting, ask your health care provider.  Limit alcohol intake to no more than 1 drink a day for nonpregnant women and 2 drinks a day for men. One drink equals 12 oz of beer, 5 oz of wine, or 1 oz of hard liquor.  Why are these changes important? Making these nutrition and lifestyle changes can:  Help you develop and maintain healthy, strong bones.  Prevent loss of bone mass and the problems that are caused by that loss, such as broken bones and delayed healing.  Make you feel better mentally and physically.  What can happen if changes are not made? Problems that can result from osteoporosis can be very serious. These may include:  A higher risk of broken bones that are painful and do not heal well.  Physical malformations, such as a collapsed spine or a hunched back.  Problems with movement.  Where to find support: If you need help making changes to prevent osteoporosis, talk with your  health care provider. You can ask for a referral to a diet and nutrition specialist (dietitian) and a physical therapist. Where to find more information: Learn more about osteoporosis from:  NIH Osteoporosis and Related Bone Diseases National Resource Center: www.niams.[employer].ch  U.S. Office on Women's Health: SodaWaters.hu.html  National Osteoporosis Foundation: NetsBook.it  Summary  Osteoporosis is a condition that  causes weak bones that are more likely to break.  Eating a healthy diet and making sure you get enough calcium and vitamin D can help prevent osteoporosis.  Other ways to reduce your risk of osteoporosis include getting regular exercise and avoiding alcohol and products that contain nicotine or tobacco. This information is not intended to replace advice given to you by your health care provider. Make sure you discuss any questions you have with your health care provider. Document Released: 12/05/2015 Document Revised: 07/31/2016 Document Reviewed: 07/31/2016 Elsevier Interactive Patient Education  [employer].

## 2018-08-29 LAB — CYTOLOGY - PAP
Diagnosis: NEGATIVE
HPV (WINDOPATH): NOT DETECTED

## 2018-09-06 ENCOUNTER — Encounter: Payer: Self-pay | Admitting: Family Medicine

## 2018-09-19 ENCOUNTER — Ambulatory Visit
Admission: RE | Admit: 2018-09-19 | Discharge: 2018-09-19 | Disposition: A | Discharge status: Home / Self Care | Payer: BC Managed Care – PPO | Source: Ambulatory Visit | Attending: Certified Nurse Midwife | Admitting: Certified Nurse Midwife

## 2018-09-19 DIAGNOSIS — Z1239 Encounter for other screening for malignant neoplasm of breast: Secondary | ICD-10-CM | POA: Diagnosis not present

## 2018-10-21 ENCOUNTER — Ambulatory Visit: Payer: BC Managed Care – PPO | Admitting: Family Medicine

## 2018-10-21 ENCOUNTER — Encounter: Payer: Self-pay | Admitting: Family Medicine

## 2018-10-21 VITALS — BP 120/78 | HR 64 | Temp 98.1°F | Ht 63.0 in | Wt 173.4 lb

## 2018-10-21 DIAGNOSIS — E669 Obesity, unspecified: Secondary | ICD-10-CM

## 2018-10-21 DIAGNOSIS — E785 Hyperlipidemia, unspecified: Secondary | ICD-10-CM

## 2018-10-21 DIAGNOSIS — I10 Essential (primary) hypertension: Secondary | ICD-10-CM

## 2018-10-21 DIAGNOSIS — E1169 Type 2 diabetes mellitus with other specified complication: Secondary | ICD-10-CM | POA: Diagnosis not present

## 2018-10-21 LAB — BASIC METABOLIC PANEL
BUN: 12 mg/dL (ref 6–23)
CHLORIDE: 100 meq/L (ref 96–112)
CO2: 30 mEq/L (ref 19–32)
Calcium: 10 mg/dL (ref 8.4–10.5)
Creatinine, Ser: 0.69 mg/dL (ref 0.40–1.20)
GFR: 94.29 mL/min (ref >60.00)
Glucose, Bld: 138 mg/dL — ABNORMAL HIGH (ref 70–99)
POTASSIUM: 4.4 meq/L (ref 3.5–5.1)
SODIUM: 139 meq/L (ref 135–145)

## 2018-10-21 LAB — HEMOGLOBIN A1C: HEMOGLOBIN A1C: 7.2 % — AB (ref 4.6–6.5)

## 2018-10-21 NOTE — Assessment & Plan Note (Signed)
-  Continue Lipitor °

## 2018-10-21 NOTE — Assessment & Plan Note (Addendum)
Well-controlled.  Continue current regimen.  Check BMP.  Patient asked whether or not she should be on a baby aspirin.  She was on this previously for a superficial phlebitis which has resolved.  I discussed primary prevention with her and that the benefit of aspirin given her age may outweigh the risk.  Discussed there is a risk of bleeding and GI bleed with this.  Discussed starting on 81 mg aspirin for primary prevention.

## 2018-10-21 NOTE — Patient Instructions (Signed)
Nice to see you. We will get lab work on you today and contact you with the results.

## 2018-10-21 NOTE — Assessment & Plan Note (Signed)
Check A1c.  Continue metformin. 

## 2018-10-21 NOTE — Progress Notes (Signed)
  Kathryn AlarEric Georgie Eduardo, MD Phone: [phone]  Kathryn RubensteinDeborah Lynne Massey is a 52 y.o. female who presents today for f/u.  CC: htn, dm, hld  HYPERTENSION Disease Monitoring: Blood pressure range-130s/70s Chest pain- no      Dyspnea- no Medications: Compliance- taking amlodipine, HCTZ, losartan, metoprolol    Edema- no  DIABETES Disease Monitoring: Blood Sugar ranges-109-140 Polyuria/phagia/dipsia- no      Optho- UTD Medications: Compliance- taking metformin Hypoglycemic symptoms- rare if she does not eat, she will feel weak then eat and improve  HYPERLIPIDEMIA Disease Monitoring: See symptoms for Hypertension Medications: Compliance- taking lipitor Right upper quadrant pain- no  Muscle aches- no     Social History   Tobacco Use  Smoking Status Never Smoker  Smokeless Tobacco Never Used     ROS see history of present illness  Objective  Physical Exam Vitals:   10/21/18 0901  BP: 120/78  Pulse: 64  Temp: 98.1 F (36.7 C)  SpO2: 98%    BP Readings from Last 3 Encounters:  10/21/18 120/78  08/27/18 134/80  07/12/16 (!) 142/70   Wt Readings from Last 3 Encounters:  10/21/18 173 lb 6.4 oz (78.7 kg)  08/27/18 176 lb 8 oz (80.1 kg)  07/12/16 187 lb (84.8 kg)    Physical Exam  Constitutional: No distress.  Cardiovascular: Normal rate, regular rhythm and normal heart sounds.  Pulmonary/Chest: Effort normal and breath sounds normal.  Musculoskeletal: She exhibits no edema.  Neurological: She is alert.  Skin: Skin is warm and dry. She is not diaphoretic.     Assessment/Plan: Please see individual problem list.  Hypertension Well-controlled.  Continue current regimen.  Check BMP.  Patient asked whether or not she should be on a baby aspirin.  She was on this previously for a superficial phlebitis which has resolved.  I discussed primary prevention with her and that the benefit of aspirin given her age may outweigh the risk.  Discussed there is a risk of bleeding and  GI bleed with this.  Discussed starting on 81 mg aspirin for primary prevention.  Diabetes mellitus type 2 in obese (HCC) Check A1c.  Continue metformin.  Hyperlipidemia Continue Lipitor.   Orders Placed This Encounter  Procedures  . Basic Metabolic Panel (BMET)  . HgB A1c    No orders of the defined types were placed in this encounter.   Kathryn AlarEric Coleman Kalas, MD [facility]

## 2018-10-29 ENCOUNTER — Other Ambulatory Visit: Payer: Self-pay | Admitting: Family Medicine

## 2018-11-09 ENCOUNTER — Other Ambulatory Visit: Payer: Self-pay | Admitting: Family Medicine

## 2018-12-29 ENCOUNTER — Other Ambulatory Visit: Payer: Self-pay | Admitting: Family Medicine

## 2019-01-10 ENCOUNTER — Other Ambulatory Visit: Payer: Self-pay

## 2019-01-10 ENCOUNTER — Telehealth: Payer: Self-pay | Admitting: Family Medicine

## 2019-01-10 MED ORDER — LOSARTAN POTASSIUM 100 MG PO TABS: ORAL_TABLET | ORAL | 1 refills | Status: DC

## 2019-01-10 NOTE — Telephone Encounter (Signed)
Sent in medication & notified patient.

## 2019-01-10 NOTE — Telephone Encounter (Signed)
Copied from CRM [id number]. Topic: Quick Communication - Rx Refill/Question >> Jan 10, 2019  3:53 PM Wyonia Hough E wrote: Medication: losartan (COZAAR) 100 MG tablet  - Pt is out of medication   Has the patient contacted their pharmacy? Yes - Pharmacy advised Pt that they sent request with no response    Preferred Pharmacy (with phone number or street name): [id number] - [place], [place] - [address] [phone] (Phone) [phone] (Fax)    Agent: Please be advised that RX refills may take up to 3 business days. We ask that you follow-up with your pharmacy.

## 2019-02-21 ENCOUNTER — Other Ambulatory Visit: Payer: Self-pay | Admitting: Family Medicine

## 2019-04-17 LAB — HM DIABETES EYE EXAM

## 2019-04-21 ENCOUNTER — Telehealth: Payer: Self-pay | Admitting: Family Medicine

## 2019-04-21 ENCOUNTER — Encounter: Payer: Self-pay | Admitting: Family Medicine

## 2019-04-21 ENCOUNTER — Ambulatory Visit (INDEPENDENT_AMBULATORY_CARE_PROVIDER_SITE_OTHER): Payer: BC Managed Care – PPO | Admitting: Family Medicine

## 2019-04-21 ENCOUNTER — Other Ambulatory Visit: Payer: Self-pay

## 2019-04-21 DIAGNOSIS — E669 Obesity, unspecified: Secondary | ICD-10-CM | POA: Diagnosis not present

## 2019-04-21 DIAGNOSIS — E1169 Type 2 diabetes mellitus with other specified complication: Secondary | ICD-10-CM | POA: Diagnosis not present

## 2019-04-21 DIAGNOSIS — I10 Essential (primary) hypertension: Secondary | ICD-10-CM | POA: Diagnosis not present

## 2019-04-21 DIAGNOSIS — E785 Hyperlipidemia, unspecified: Secondary | ICD-10-CM | POA: Diagnosis not present

## 2019-04-21 NOTE — Telephone Encounter (Signed)
Please call the patient and get her set up for labs some time in the next 3-4 weeks. She needs follow-up in the office in 3-4 months. Thanks.

## 2019-04-21 NOTE — Assessment & Plan Note (Signed)
Continue lipitor. Check lipid panel.  

## 2019-04-21 NOTE — Progress Notes (Signed)
Virtual Visit via video Note  This visit type was conducted due to national recommendations for restrictions regarding the COVID-19 pandemic (e.g. social distancing).  This format is felt to be most appropriate for this patient at this time.  All issues noted in this document were discussed and addressed.  No physical exam was performed (except for noted visual exam findings with Video Visits).   I connected with Kathryn Massey today at  9:00 AM EDT by a video enabled telemedicine application and verified that I am speaking with the correct person using two identifiers. 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 I also discussed with the patient that there may be a patient responsible charge related to this service. The patient expressed understanding and agreed to proceed.   Reason for visit: follow-up  HPI: HYPERTENSION Disease Monitoring: Blood pressure range-130s/70s Chest pain- no      Dyspnea- no Medications: Compliance- taking amlodipine, losartan, HCTZ   Edema- no Patient has started exercising on the treadmill and elliptical 4 days a week. Also riding bikes with her daughter. Has lost a few pounds.  DIABETES Disease Monitoring: Blood Sugar ranges-not checking Polyuria/phagia/dipsia- no      Optho- states saw 04/17/19 Medications: Compliance- taking metformin Hypoglycemic symptoms- no  HYPERLIPIDEMIA Disease Monitoring: See symptoms for Hypertension Medications: Compliance- taking lipitor Right upper quadrant pain- no  Muscle aches- no   ROS: See pertinent positives and negatives per HPI.  Past Medical History:  Diagnosis Date  . Diabetes mellitus without complication (HCC)    borderline  . DVT femoral (deep  venous thrombosis) with thrombophlebitis, left ([place]) 2004   had boot on left LE after foot surgery  . Hypertension   . Liver disorder 2010   mild liver enzyme elevation  . Osteopenia 2016   mild DEXA scores -1.4, -1.5    Past Surgical History:  Procedure Laterality Date  . COLONOSCOPY  2010   benign polyps  . COLONOSCOPY WITH PROPOFOL N/A 09/27/2015   Procedure: COLONOSCOPY WITH PROPOFOL;  Surgeon: Lollie Sails, MD;  Location: [facility] ENDOSCOPY;  Service: Endoscopy;  Laterality: N/A;  . FOOT SURGERY Left 2004   Dr. Vickki Muff tendon release for plantar fasciitis  . PUBOVAGINAL SLING  2005   Dr. Amalia Hailey mesh sling for SVI    Family History  Problem Relation Age of Onset  . Hypertension Mother   . Diabetes Mother   . COPD Father   . Hypertension Father   . Cirrhosis Father 3       deceased  . Heart disease Brother 80       had bypass surgery  . Diabetes Brother   . Hypertension Brother   . Hyperlipidemia Sister   . Lung cancer Maternal Uncle   . Lung cancer Maternal Grandmother   . Breast cancer Neg Hx     SOCIAL HX: nonsmoker   Current Outpatient Medications:  .  amLODipine (NORVASC) 5 MG tablet, TAKE 1 TABLET BY MOUTH EVERY DAY, Disp: 90 tablet, Rfl: 3 .  atorvastatin (LIPITOR) 40 MG tablet, TAKE 1 TABLET BY MOUTH EVERY DAY, Disp: 90 tablet, Rfl: 3 .  Blood Glucose Monitoring Suppl (ONE TOUCH ULTRA SYSTEM KIT) W/DEVICE KIT, 1 kit by Does not apply route once., Disp: 1 each, Rfl: 0 .  glucose blood (  ONE TOUCH ULTRA TEST) test strip, Use as instructed, Disp: 100 each, Rfl: 12 .  hydrochlorothiazide (HYDRODIURIL) 25 MG tablet, TAKE 1 TABLET BY MOUTH EVERY DAY, Disp: 90 tablet, Rfl: 1 .  losartan (COZAAR) 100 MG tablet, TAKE 1 TABLET BY MOUTH EVERY DAY, Disp: 90 tablet, Rfl: 1 .  metFORMIN (GLUCOPHAGE-XR) 500 MG 24 hr tablet, TAKE 2 TABLETS (1,000 MG TOTAL) BY MOUTH 2 (TWO) TIMES DAILY WITH A MEAL., Disp: 360 tablet, Rfl: 2 .  metoprolol succinate (TOPROL-XL) 100 MG 24 hr  tablet, TAKE 1 TABLET BY MOUTH EVERY DAY (TAKE WITH OR IMMEDIATELY FOLLOWING A MEAL), Disp: 90 tablet, Rfl: 3  EXAM:  VITALS per patient if applicable: none  GENERAL: alert, oriented, appears well and in no acute distress  HEENT: atraumatic, conjunttiva clear, no obvious abnormalities on inspection of external nose and ears  NECK: normal movements of the head and neck  LUNGS: on inspection no signs of respiratory distress, breathing rate appears normal, no obvious gross SOB, gasping or wheezing  CV: no obvious cyanosis  MS: moves all visible extremities without noticeable abnormality  PSYCH/NEURO: pleasant and cooperative, no obvious depression or anxiety, speech and thought processing grossly intact  ASSESSMENT AND PLAN:  Discussed the following assessment and plan:  Essential hypertension - Plan: Comp Met (CMET)  Diabetes mellitus type 2 in obese (HCC) - Plan: Hemoglobin A1c, Urine Microalbumin w/creat. ratio  Hyperlipidemia, unspecified hyperlipidemia type - Plan: Lipid panel  Hypertension Slightly above goal. Patient has recently started exercising and I think it is reasonable to allow her 3 months to work on diet and exercise and weight loss and if her BP remains above goal at that time we will alter her regimen. She will continue her current regimen. She will come in for lab work in the next several weeks.   Diabetes mellitus type 2 in obese (HCC) Continue metformin. Check a1c and urine microalbumin.   Hyperlipidemia Continue lipitor. Check lipid panel.   CMA will call to schedule follow-up in 3-4 months and lab work in the next several weeks.   Social distancing precautions and sick precautions given regarding COVID19.    I discussed the assessment and treatment plan with the patient. The patient was provided an opportunity to ask questions and all were answered. The patient agreed with the plan and demonstrated an understanding of the instructions.   The patient  was advised to call back or seek an in-person evaluation if the symptoms worsen or if the condition fails to improve as anticipated.   Tommi Rumps, MD

## 2019-04-21 NOTE — Telephone Encounter (Signed)
Called pt's home number and left a detailed VM to call me back on my direct number.   Will call pt's cell next   [phone] (M) [phone] (H)

## 2019-04-21 NOTE — Assessment & Plan Note (Signed)
Continue metformin. Check a1c and urine microalbumin.

## 2019-04-21 NOTE — Assessment & Plan Note (Signed)
Slightly above goal. Patient has recently started exercising and I think it is reasonable to allow her 3 months to work on diet and exercise and weight loss and if her BP remains above goal at that time we will alter her regimen. She will continue her current regimen. She will come in for lab work in the next several weeks.

## 2019-04-22 NOTE — Telephone Encounter (Signed)
Called and spoke with pt. Pt scheduled for labs and 4 month follow up

## 2019-04-29 ENCOUNTER — Ambulatory Visit (INDEPENDENT_AMBULATORY_CARE_PROVIDER_SITE_OTHER): Payer: BC Managed Care – PPO | Admitting: Family Medicine

## 2019-04-29 ENCOUNTER — Ambulatory Visit (INDEPENDENT_AMBULATORY_CARE_PROVIDER_SITE_OTHER): Payer: BC Managed Care – PPO

## 2019-04-29 ENCOUNTER — Encounter: Payer: Self-pay | Admitting: Family Medicine

## 2019-04-29 ENCOUNTER — Other Ambulatory Visit: Payer: Self-pay

## 2019-04-29 DIAGNOSIS — R0781 Pleurodynia: Secondary | ICD-10-CM

## 2019-04-29 NOTE — Progress Notes (Signed)
Virtual Visit via video Note  This visit type was conducted due to national recommendations for restrictions regarding the COVID-19 pandemic (e.g. social distancing).  This format is felt to be most appropriate for this patient at this time.  All issues noted in this document were discussed and addressed.  No physical exam was performed (except for noted visual exam findings with Video Visits).   I connected with Kathryn Massey today at  9:30 AM EDT by a video enabled telemedicine application and verified that I am speaking with the correct person using two identifiers. Location patient: work Location provider: work Persons participating in the virtual visit: patient, provider  I discussed the limitations, risks, security and privacy concerns of performing an evaluation and management service by telephone and the availability of in person appointments. I also discussed with the patient that there may be a patient responsible charge related to this service. The patient expressed understanding and agreed to proceed.   Reason for visit: same day visit  HPI: Rib pain: Patient notes she slipped and fell on Sunday and fell into a metal bar on the right lateral aspect of her ribs.  She has had discomfort in that area since then.  Feels sore and cannot bend over or pick anything up.  She notes no bruising.  She notes no shortness of breath.  She notes she is sore with deep breathing.  She has had no fevers or cough.  No chest pain.  She has been using ice and Tylenol.  She is having difficulty at work related to this.   ROS: See pertinent positives and negatives per HPI.  Past Medical History:  Diagnosis Date  . Diabetes mellitus without complication (HCC)    borderline  . DVT femoral (deep venous thrombosis) with thrombophlebitis, left ([place]) 2004   had boot on left LE after foot surgery  . Hypertension   . Liver disorder 2010   mild liver enzyme elevation  . Osteopenia 2016   mild DEXA  scores -1.4, -1.5    Past Surgical History:  Procedure Laterality Date  . COLONOSCOPY  2010   benign polyps  . COLONOSCOPY WITH PROPOFOL N/A 09/27/2015   Procedure: COLONOSCOPY WITH PROPOFOL;  Surgeon: Lollie Sails, MD;  Location: [facility] ENDOSCOPY;  Service: Endoscopy;  Laterality: N/A;  . FOOT SURGERY Left 2004   Dr. Vickki Muff tendon release for plantar fasciitis  . PUBOVAGINAL SLING  2005   Dr. Amalia Hailey mesh sling for SVI    Family History  Problem Relation Age of Onset  . Hypertension Mother   . Diabetes Mother   . COPD Father   . Hypertension Father   . Cirrhosis Father 54       deceased  . Heart disease Brother 38       had bypass surgery  . Diabetes Brother   . Hypertension Brother   . Hyperlipidemia Sister   . Lung cancer Maternal Uncle   . Lung cancer Maternal Grandmother   . Breast cancer Neg Hx     SOCIAL HX: Non-smoker.   Current Outpatient Medications:  .  amLODipine (NORVASC) 5 MG tablet, TAKE 1 TABLET BY MOUTH EVERY DAY, Disp: 90 tablet, Rfl: 3 .  atorvastatin (LIPITOR) 40 MG tablet, TAKE 1 TABLET BY MOUTH EVERY DAY, Disp: 90 tablet, Rfl: 3 .  Blood Glucose Monitoring Suppl (ONE TOUCH ULTRA SYSTEM KIT) W/DEVICE KIT, 1 kit by Does not apply route once., Disp: 1 each, Rfl: 0 .  glucose blood (ONE TOUCH  ULTRA TEST) test strip, Use as instructed, Disp: 100 each, Rfl: 12 .  hydrochlorothiazide (HYDRODIURIL) 25 MG tablet, TAKE 1 TABLET BY MOUTH EVERY DAY, Disp: 90 tablet, Rfl: 1 .  losartan (COZAAR) 100 MG tablet, TAKE 1 TABLET BY MOUTH EVERY DAY, Disp: 90 tablet, Rfl: 1 .  metFORMIN (GLUCOPHAGE-XR) 500 MG 24 hr tablet, TAKE 2 TABLETS (1,000 MG TOTAL) BY MOUTH 2 (TWO) TIMES DAILY WITH A MEAL., Disp: 360 tablet, Rfl: 2 .  metoprolol succinate (TOPROL-XL) 100 MG 24 hr tablet, TAKE 1 TABLET BY MOUTH EVERY DAY (TAKE WITH OR IMMEDIATELY FOLLOWING A MEAL), Disp: 90 tablet, Rfl: 3  EXAM:  VITALS per patient if applicable: None.  GENERAL: alert, oriented, appears well  and in no acute distress  HEENT: atraumatic, conjunttiva clear, no obvious abnormalities on inspection of external nose and ears  NECK: normal movements of the head and neck  LUNGS: on inspection no signs of respiratory distress, breathing rate appears normal, no obvious gross SOB, gasping or wheezing  CV: no obvious cyanosis  MS: moves all visible extremities without noticeable abnormality  PSYCH/NEURO: pleasant and cooperative, no obvious depression or anxiety, speech and thought processing grossly intact  ASSESSMENT AND PLAN:  Discussed the following assessment and plan:  Rib pain on right side - Plan: DG Ribs Unilateral W/Chest Right  Rib pain on right side Concern for rib fracture versus soft tissue injury.  She will come in for an x-ray.  Further treatment depends on the results of the x-ray.  She is given return precautions.    I discussed the assessment and treatment plan with the patient. The patient was provided an opportunity to ask questions and all were answered. The patient agreed with the plan and demonstrated an understanding of the instructions.   The patient was advised to call back or seek an in-person evaluation if the symptoms worsen or if the condition fails to improve as anticipated.   Tommi Rumps, MD

## 2019-05-01 DIAGNOSIS — S2239XA Fracture of one rib, unspecified side, initial encounter for closed fracture: Secondary | ICD-10-CM | POA: Insufficient documentation

## 2019-05-01 NOTE — Assessment & Plan Note (Signed)
Concern for rib fracture versus soft tissue injury.  She will come in for an x-ray.  Further treatment depends on the results of the x-ray.  She is given return precautions.

## 2019-05-02 ENCOUNTER — Encounter: Payer: Self-pay | Admitting: Family Medicine

## 2019-05-02 MED ORDER — METFORMIN HCL ER 500 MG PO TB24: 1000.0000 mg | ORAL_TABLET | Freq: Two times a day (BID) | ORAL | 2 refills | Status: DC

## 2019-05-09 ENCOUNTER — Encounter: Payer: Self-pay | Admitting: Family Medicine

## 2019-06-02 ENCOUNTER — Other Ambulatory Visit: Payer: Self-pay

## 2019-06-02 ENCOUNTER — Other Ambulatory Visit (INDEPENDENT_AMBULATORY_CARE_PROVIDER_SITE_OTHER): Payer: BC Managed Care – PPO

## 2019-06-02 DIAGNOSIS — E785 Hyperlipidemia, unspecified: Secondary | ICD-10-CM

## 2019-06-02 DIAGNOSIS — E1169 Type 2 diabetes mellitus with other specified complication: Secondary | ICD-10-CM

## 2019-06-02 DIAGNOSIS — E669 Obesity, unspecified: Secondary | ICD-10-CM | POA: Diagnosis not present

## 2019-06-02 DIAGNOSIS — I10 Essential (primary) hypertension: Secondary | ICD-10-CM | POA: Diagnosis not present

## 2019-06-02 LAB — COMPREHENSIVE METABOLIC PANEL
ALT: 25 U/L (ref 0–35)
AST: 22 U/L (ref 0–37)
Albumin: 4.6 g/dL (ref 3.5–5.2)
Alkaline Phosphatase: 68 U/L (ref 39–117)
BUN: 14 mg/dL (ref 6–23)
CO2: 32 mEq/L (ref 19–32)
Calcium: 10.1 mg/dL (ref 8.4–10.5)
Chloride: 100 mEq/L (ref 96–112)
Creatinine, Ser: 0.67 mg/dL (ref 0.40–1.20)
GFR: 91.56 mL/min (ref >60.00)
Glucose, Bld: 151 mg/dL — ABNORMAL HIGH (ref 70–99)
Potassium: 5 mEq/L (ref 3.5–5.1)
Sodium: 140 mEq/L (ref 135–145)
Total Bilirubin: 0.6 mg/dL (ref 0.2–1.2)
Total Protein: 7 g/dL (ref 6.0–8.3)

## 2019-06-02 LAB — MICROALBUMIN / CREATININE URINE RATIO
Creatinine,U: 133.2 mg/dL
Microalb Creat Ratio: 1.2 mg/g (ref 0.0–30.0)
Microalb, Ur: 1.6 mg/dL (ref 0.0–1.9)

## 2019-06-02 LAB — LIPID PANEL
Cholesterol: 114 mg/dL (ref 0–200)
HDL: 49.4 mg/dL (ref >39.00)
LDL Cholesterol: 35 mg/dL (ref 0–99)
NonHDL: 64.25
Total CHOL/HDL Ratio: 2
Triglycerides: 144 mg/dL (ref 0.0–149.0)
VLDL: 28.8 mg/dL (ref 0.0–40.0)

## 2019-06-02 LAB — HEMOGLOBIN A1C: Hgb A1c MFr Bld: 7.4 % — ABNORMAL HIGH (ref 4.6–6.5)

## 2019-06-16 ENCOUNTER — Encounter: Payer: Self-pay | Admitting: Family Medicine

## 2019-06-17 MED ORDER — EMPAGLIFLOZIN 10 MG PO TABS: 10.0000 mg | ORAL_TABLET | Freq: Every day | ORAL | 1 refills | Status: DC

## 2019-06-20 ENCOUNTER — Other Ambulatory Visit: Payer: Self-pay | Admitting: Family Medicine

## 2019-07-01 ENCOUNTER — Other Ambulatory Visit: Payer: Self-pay | Admitting: Family Medicine

## 2019-08-14 ENCOUNTER — Other Ambulatory Visit: Payer: Self-pay

## 2019-08-18 ENCOUNTER — Ambulatory Visit: Payer: BC Managed Care – PPO | Admitting: Family Medicine

## 2019-09-01 ENCOUNTER — Ambulatory Visit: Payer: BC Managed Care – PPO | Admitting: Family Medicine

## 2019-09-01 ENCOUNTER — Other Ambulatory Visit: Payer: Self-pay

## 2019-09-01 ENCOUNTER — Encounter: Payer: Self-pay | Admitting: Family Medicine

## 2019-09-01 DIAGNOSIS — E669 Obesity, unspecified: Secondary | ICD-10-CM | POA: Diagnosis not present

## 2019-09-01 DIAGNOSIS — I10 Essential (primary) hypertension: Secondary | ICD-10-CM | POA: Diagnosis not present

## 2019-09-01 DIAGNOSIS — E1169 Type 2 diabetes mellitus with other specified complication: Secondary | ICD-10-CM | POA: Diagnosis not present

## 2019-09-01 DIAGNOSIS — M25561 Pain in right knee: Secondary | ICD-10-CM | POA: Insufficient documentation

## 2019-09-01 DIAGNOSIS — S2239XD Fracture of one rib, unspecified side, subsequent encounter for fracture with routine healing: Secondary | ICD-10-CM

## 2019-09-01 DIAGNOSIS — Z23 Encounter for immunization: Secondary | ICD-10-CM | POA: Diagnosis not present

## 2019-09-01 LAB — BASIC METABOLIC PANEL
BUN: 14 mg/dL (ref 6–23)
CO2: 31 mEq/L (ref 19–32)
Calcium: 10.9 mg/dL — ABNORMAL HIGH (ref 8.4–10.5)
Chloride: 100 mEq/L (ref 96–112)
Creatinine, Ser: 0.71 mg/dL (ref 0.40–1.20)
GFR: 85.56 mL/min (ref >60.00)
Glucose, Bld: 99 mg/dL (ref 70–99)
Potassium: 5 mEq/L (ref 3.5–5.1)
Sodium: 141 mEq/L (ref 135–145)

## 2019-09-01 LAB — HEMOGLOBIN A1C: Hgb A1c MFr Bld: 7 % — ABNORMAL HIGH (ref 4.6–6.5)

## 2019-09-01 NOTE — Assessment & Plan Note (Signed)
Given description and location this would seem to be related to a possible meniscal issue.  Could also represent arthritis.  She has improved at this point.  Discussed as needed ibuprofen for this.  Discussed walking and building up the muscles in her legs.  If this worsens or she needs increasing amounts of ibuprofen she will contact us so we can have her see orthopedics.

## 2019-09-01 NOTE — Assessment & Plan Note (Signed)
Patient has recovered well.  No additional pain.

## 2019-09-01 NOTE — Progress Notes (Signed)
Tommi Rumps, MD Phone: [phone]  Kathryn Massey is a 53 y.o. female who presents today for f/u.  HYPERTENSION  Disease Monitoring  Home BP Monitoring 126-135/65-76 Chest pain- no    Dyspnea- no Medications  Compliance-  Taking amlodipine, HCTZ, losartan, metoprolol.  Edema- no  DIABETES Disease Monitoring: Blood Sugar ranges-140 on last check Polyuria/phagia/dipsia- no      Optho- UTD Medications: Compliance- taking metformin, jardiance Hypoglycemic symptoms- one time when she had not eaten, resolved with food intake  Right knee pain: Patient has a history of a meniscal tear.  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  It was on the lateral joint line and her to the point where she had difficulty bearing weight.  No specific injury.  No swelling.  She used ice and Tylenol and ibuprofen intermittently.  The pain has improved at this time and she does not have any today.  She does note it rarely will give out on her.  It will not lock on her.  It will pop.  She did previously see Dr. Tamala Julian for this.  Rib fracture: Patient notes no additional pain.   Social History   Tobacco Use  Smoking Status Never Smoker  Smokeless Tobacco Never Used     ROS see history of present illness  Objective  Physical Exam Vitals:   09/01/19 1030  BP: 130/70  Pulse: 63  Temp: 97.8 F (36.6 C)  SpO2: 99%    BP Readings from Last 3 Encounters:  09/01/19 130/70  10/21/18 120/78  08/27/18 134/80   Wt Readings from Last 3 Encounters:  09/01/19 171 lb 12.8 oz (77.9 kg)  10/21/18 173 lb 6.4 oz (78.7 kg)  08/27/18 176 lb 8 oz (80.1 kg)    Physical Exam Constitutional:      General: She is not in acute distress.    Appearance: She is not diaphoretic.  Cardiovascular:     Rate and Rhythm: Normal rate and regular rhythm.     Heart sounds: Normal heart sounds.  Pulmonary:     Effort: Pulmonary  effort is normal.     Breath sounds: Normal breath sounds.  Musculoskeletal:     Comments: Bilateral knees with no warmth, swelling, erythema, or tenderness, negative McMurray's bilaterally  Skin:    General: Skin is warm and dry.  Neurological:     Mental Status: She is alert.      Assessment/Plan: Please see individual problem list.  Hypertension Adequately controlled at home and in the office today.  She will continue her current regimen.  Diabetes mellitus type 2 in obese (HCC) Check A1c.  Continue current regimen.  Right knee pain Given description and location this would seem to be related to a possible meniscal issue.  Could also represent arthritis.  She has improved at this point.  Discussed as needed ibuprofen for this.  Discussed walking and building up the muscles in her legs.  If this worsens or she needs increasing amounts of ibuprofen she will contact us so we can have her see orthopedics.  Rib fracture Patient has recovered well.  No additional pain.   Health Maintenance: Flu vaccine given today.  Orders Placed This Encounter  Procedures  . Flu Vaccine QUAD 36+ mos IM  . HgB A1c  . Basic Metabolic Panel (BMET)    No orders of the defined types were placed in this  encounter.    Tommi Rumps, MD [place]

## 2019-09-01 NOTE — Patient Instructions (Signed)
Nice to see you. Please continue to monitor your blood pressure. Please monitor your right knee.  If it starts to bother you more frequently or you notice that it is locking or giving out more frequently please let us know we can have you see orthopedics. We will get lab work today and contact you with the results.

## 2019-09-01 NOTE — Assessment & Plan Note (Signed)
Check A1c.  Continue current regimen. 

## 2019-09-01 NOTE — Assessment & Plan Note (Signed)
Adequately controlled at home and in the office today.  She will continue her current regimen.

## 2019-09-02 ENCOUNTER — Other Ambulatory Visit: Payer: Self-pay | Admitting: Family Medicine

## 2019-09-04 ENCOUNTER — Encounter: Payer: Self-pay | Admitting: Family Medicine

## 2019-09-11 ENCOUNTER — Ambulatory Visit (INDEPENDENT_AMBULATORY_CARE_PROVIDER_SITE_OTHER): Payer: BC Managed Care – PPO | Admitting: Certified Nurse Midwife

## 2019-09-11 ENCOUNTER — Other Ambulatory Visit: Payer: Self-pay

## 2019-09-11 ENCOUNTER — Encounter: Payer: Self-pay | Admitting: Certified Nurse Midwife

## 2019-09-11 VITALS — BP 130/70 | HR 68 | Ht 63.0 in | Wt 174.0 lb

## 2019-09-11 DIAGNOSIS — Z1239 Encounter for other screening for malignant neoplasm of breast: Secondary | ICD-10-CM

## 2019-09-11 DIAGNOSIS — Z1231 Encounter for screening mammogram for malignant neoplasm of breast: Secondary | ICD-10-CM

## 2019-09-11 DIAGNOSIS — Z01419 Encounter for gynecological examination (general) (routine) without abnormal findings: Secondary | ICD-10-CM

## 2019-09-11 NOTE — Progress Notes (Addendum)
Gynecology Annual Exam  PCP: Leone Haven, MD  Chief Complaint:  Chief Complaint  Patient presents with  . Gynecologic Exam    History of Present Illness: Kathryn Massey is a 53 y.o. [phone] who presents for her annual gyn exam. The patient has no complaints today.  Her menses are absent and she is postmenopausal since 2010.  She denies any vaginal spotting. Does still get hot flashes at night, at least one which will wake her at night. Last pap smear: 08/27/2018, results were NIL/negative HRHPV   The patient is sexually active.  She does not have dyspareunia.  Since her last visit, she has been started on Jardiance which has helped her reduce her hemoglobin A1C from 7.4% to 7%.  Her oldest son is a Software engineer  in [place] and her daughter is at [employer] and is premed.  Her past medical history is remarkable for hypertension, hyperlipidemia, diabetes, and osteopenia.She also has a history of DVT which developed when she had a boot on her lower leg after foot surgery and a superficial phlebitis in the right inframammary area.  The patient does perform self breast exams. Her last bilateral screening mammogram was 09/19/2018, results were negative.   There is no family history of breast cancer.   There is no family history of ovarian cancer.   She had a colonoscopy  09/27/2015. Results: hyperplastic polyps. Next one due: 2026   The patient denies smoking.  She denies drinking.  She denies illegal drug use.  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 She does get adequate calcium in her diet. Dexa scan 07/05/2015 revealed mild osteopenia.   A recent cholesterol screen 2019 was normal.   The patient denies current symptoms of depression.    Review of Systems: Review of Systems  Constitutional: Negative for chills, fever and weight loss.  HENT: Negative for congestion, sinus pain and sore throat.   Eyes: Negative for blurred vision and pain.   Respiratory: Negative for hemoptysis, shortness of breath and wheezing.   Cardiovascular: Negative for chest pain, palpitations and leg swelling.  Gastrointestinal: Negative for abdominal pain, blood in stool, diarrhea, heartburn, nausea and vomiting.  Genitourinary: Negative for dysuria, frequency, hematuria and urgency.  Musculoskeletal: Negative for back pain, joint pain and myalgias.  Skin: Negative for itching and rash.  Neurological: Negative for dizziness, tingling and headaches.  Endo/Heme/Allergies: Negative for environmental allergies and polydipsia. Does not bruise/bleed easily.       Negative for hirsutism   Psychiatric/Behavioral: Negative for depression. The patient is not nervous/anxious and does not have insomnia.     Past Medical History:  Past Medical History:  Diagnosis Date  . Diabetes mellitus without complication (HCC)    borderline  . DVT femoral (deep venous thrombosis) with thrombophlebitis, left ([place]) 2004   had boot on left LE after foot surgery  . Hypertension   . Liver disorder 2010   mild liver enzyme elevation  . Osteopenia 2016   mild DEXA scores -1.4, -1.5    Past Surgical History:  Past Surgical History:  Procedure Laterality Date  . COLONOSCOPY  2010   benign polyps  . COLONOSCOPY WITH PROPOFOL N/A 09/27/2015   Procedure: COLONOSCOPY WITH PROPOFOL;  Surgeon: Lollie Sails, MD;  Location: [facility] ENDOSCOPY;  Service: Endoscopy;  Laterality: N/A;  . FOOT SURGERY Left 2004   Dr. Vickki Muff tendon release for plantar fasciitis  . PUBOVAGINAL SLING  2005   Dr. Amalia Hailey mesh sling  for SVI    Family History:  Family History  Problem Relation Age of Onset  . Hypertension Mother   . Diabetes Mother   . COPD Father   . Hypertension Father   . Cirrhosis Father 64       deceased  . Heart disease Brother 42       had bypass surgery  . Diabetes Brother   . Hypertension Brother   . Hyperlipidemia Sister   . Lung cancer Maternal Uncle   . Lung  cancer Maternal Grandmother   . Breast cancer Neg Hx     Social History:  Social History   Socioeconomic History  . Marital status: Married    Spouse name: Richard Personnel officer"  . Number of children: 3  . Years of education: 91  . Highest education level: Not on file  Occupational History  . 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 Financial resource strain: Not on file  . Food insecurity    Worry: Not on file    Inability: Not on file  . Transportation needs    Medical: Not on file    Non-medical: Not on file  Tobacco Use  . Smoking status: Never Smoker  . Smokeless tobacco: Never Used  Substance and Sexual Activity  . Alcohol use: Yes    Comment: rarely  . Drug use: No  . Sexual activity: Yes    Partners: Male    Birth control/protection: Post-menopausal  Lifestyle  . Physical activity    Days per week: 0 days    Minutes per session: 0 min  . Stress: Not at all  Relationships  . Social Herbalist on phone: Not on file    Gets together: Not on file    Attends religious service: Not on file    Active member of club or organization: Not on file    Attends meetings of clubs or organizations: Not on file    Relationship status: Not on file  . Intimate partner violence    Fear of current or ex partner: Not on file    Emotionally abused: Not on file    Physically abused: Not on file    Forced sexual activity: Not on file  Other Topics Concern  . Not on file  Social History Narrative   Lives in [place] with husband and daughter and son.     Work - [employer], nutritionist   Diet - Healthy   Exercise - walks 2-3 times per week.    Allergies:  No Known Allergies  Medications:  Current Outpatient Medications on File Prior to Visit  Medication Sig Dispense Refill  . amLODipine (NORVASC) 5 MG tablet TAKE 1 TABLET BY MOUTH EVERY DAY 90 tablet 3  . atorvastatin (LIPITOR) 40 MG tablet TAKE 1 TABLET BY MOUTH EVERY DAY 90 tablet 3  . Blood  Glucose Monitoring Suppl (ONE TOUCH ULTRA SYSTEM KIT) W/DEVICE KIT 1 kit by Does not apply route once. 1 each 0  . empagliflozin (JARDIANCE) 10 MG TABS tablet Take 10 mg by mouth daily. 90 tablet 1  . glucose blood (ONE TOUCH ULTRA TEST) test strip Use as instructed 100 each 12  . hydrochlorothiazide (HYDRODIURIL) 25 MG tablet TAKE 1 TABLET BY MOUTH EVERY DAY 90 tablet 1  . losartan (COZAAR) 100 MG tablet TAKE 1 TABLET BY MOUTH EVERY DAY 90 tablet 1  . metFORMIN (GLUCOPHAGE-XR) 500 MG 24 hr tablet Take 2 tablets (1,000 mg total) by mouth  2 (two) times daily with a meal. 360 tablet 2  . metoprolol succinate (TOPROL-XL) 100 MG 24 hr tablet TAKE 1 TABLET BY MOUTH EVERY DAY (TAKE WITH OR IMMEDIATELY FOLLOWING A MEAL) 90 tablet 3   No current facility-administered medications on file prior to visit.     Physical Exam Vitals: BP 130/70   Pulse 68   Ht '5\' 3"'  (1.6 m)   Wt 78.9 kg   LMP  (LMP Unknown)   BMI 30.82 kg/m   General: WF in NAD HEENT: normocephalic, anicteric Neck: no thyroid enlargement, no palpable nodules, no cervical lymphadenopathy  Pulmonary: No increased work of breathing, CTAB Cardiovascular: RRR, with Grade II/VI systolic murmur best heard at LSB with diaphragm Breast: Breast symmetrical, no tenderness, no palpable nodules or masses, no skin or nipple retraction present, no nipple discharge.  No axillary, infraclavicular or supraclavicular lymphadenopathy. Abdomen: Soft, non-tender, non-distended.  Umbilicus without lesions.  No hepatomegaly or masses palpable. No evidence of hernia. Genitourinary:  External: Multiple epidermal cysts on bilateral labia majora.  Normal urethral meatus, normal Bartholin's and Skene's glands.    Vagina: Normal vaginal mucosa, no evidence of prolapse, multiparous introitus    Cervix: Grossly normal in appearance, no bleeding, non-tender  Uterus: Anteflexed, normal size, shape, and consistency, mobile, and non-tender  Adnexa: No adnexal masses,  non-tender  Rectal: deferred  Lymphatic: no evidence of inguinal lymphadenopathy Extremities: no edema, erythema, or tenderness Neurologic: Grossly intact Psychiatric: mood appropriate, affect full Skin: 3 cm round intradermal lesion to right of spine in thoracic area (probable lipoma)     Assessment: 53 y.o. [id number] annual gyn exam  Plan:   1) Breast cancer screening - recommend monthly self breast exam and annual screening mammograms. Mammogram was ordered today. Patient to schedule mammogram at [facility]  2) Colon cancer screening-colonoscopy UTD  3) Cervical cancer screening - Pap was not done. ASCCP guidelines and rational discussed.  New BCBS policy discussed. Patient opts for every 3 year screening interval  4) Osteoporosis prevention: calcium and vitamin D3 requirements discussed. Encouraged vitamin D3 supplementation 1000 IU daily.   5) RTO in 1 year and prn. Did discuss some non hormonal treatments for vasomotor symptoms.   Dalia Heading, CNM

## 2019-09-19 ENCOUNTER — Other Ambulatory Visit: Payer: Self-pay

## 2019-09-19 ENCOUNTER — Other Ambulatory Visit (INDEPENDENT_AMBULATORY_CARE_PROVIDER_SITE_OTHER): Payer: BC Managed Care – PPO

## 2019-09-22 LAB — PTH, INTACT AND CALCIUM
Calcium: 10.2 mg/dL (ref 8.6–10.4)
PTH: 32 pg/mL (ref 14–64)

## 2019-10-15 ENCOUNTER — Other Ambulatory Visit: Payer: Self-pay

## 2019-10-15 ENCOUNTER — Ambulatory Visit
Admission: RE | Admit: 2019-10-15 | Discharge: 2019-10-15 | Disposition: A | Discharge status: Home / Self Care | Payer: BC Managed Care – PPO | Source: Ambulatory Visit | Attending: Certified Nurse Midwife | Admitting: Certified Nurse Midwife

## 2019-10-15 DIAGNOSIS — Z1231 Encounter for screening mammogram for malignant neoplasm of breast: Secondary | ICD-10-CM | POA: Diagnosis present

## 2019-10-22 ENCOUNTER — Other Ambulatory Visit: Payer: Self-pay | Admitting: Family Medicine

## 2019-11-20 ENCOUNTER — Other Ambulatory Visit: Payer: Self-pay | Admitting: Family Medicine

## 2019-12-10 ENCOUNTER — Other Ambulatory Visit: Payer: Self-pay | Admitting: Family Medicine

## 2019-12-21 ENCOUNTER — Other Ambulatory Visit: Payer: Self-pay | Admitting: Family Medicine

## 2019-12-26 ENCOUNTER — Other Ambulatory Visit: Payer: Self-pay | Admitting: Family Medicine

## 2020-01-05 ENCOUNTER — Encounter: Payer: Self-pay | Admitting: Family Medicine

## 2020-01-05 ENCOUNTER — Other Ambulatory Visit: Payer: Self-pay

## 2020-01-05 ENCOUNTER — Ambulatory Visit (INDEPENDENT_AMBULATORY_CARE_PROVIDER_SITE_OTHER): Payer: BC Managed Care – PPO | Admitting: Family Medicine

## 2020-01-05 VITALS — Ht 63.0 in | Wt 169.0 lb

## 2020-01-05 DIAGNOSIS — E1169 Type 2 diabetes mellitus with other specified complication: Secondary | ICD-10-CM | POA: Diagnosis not present

## 2020-01-05 DIAGNOSIS — E669 Obesity, unspecified: Secondary | ICD-10-CM | POA: Diagnosis not present

## 2020-01-05 DIAGNOSIS — M8580 Other specified disorders of bone density and structure, unspecified site: Secondary | ICD-10-CM | POA: Diagnosis not present

## 2020-01-05 DIAGNOSIS — I10 Essential (primary) hypertension: Secondary | ICD-10-CM

## 2020-01-05 MED ORDER — METOPROLOL SUCCINATE ER 100 MG PO TB24: ORAL_TABLET | ORAL | 3 refills | Status: DC

## 2020-01-05 MED ORDER — METFORMIN HCL ER 500 MG PO TB24: 1000.0000 mg | ORAL_TABLET | Freq: Two times a day (BID) | ORAL | 2 refills | Status: DC

## 2020-01-05 MED ORDER — GLUCOSE BLOOD VI STRP: ORAL_STRIP | 12 refills | Status: AC

## 2020-01-05 NOTE — Assessment & Plan Note (Signed)
Undetermined control.  She will continue her current regimen.  I have asked her to check her CBGs several times a week and send [place] her readings.  A1c in 2 months.

## 2020-01-05 NOTE — Assessment & Plan Note (Signed)
Bone density scan ordered.  She will continue vitamin D.

## 2020-01-05 NOTE — Assessment & Plan Note (Addendum)
Slightly above goal though she is not checking consistently.  She will check daily for 1 week and send me her readings.  If above goal we will increase her amlodipine to 10 mg once daily.  She will continue her other medications.  Labs to be completed in 2 months.

## 2020-01-05 NOTE — Progress Notes (Signed)
Virtual Visit via video Note  This visit type was conducted due to national recommendations for restrictions regarding the COVID-19 pandemic (e.g. social distancing).  This format is felt to be most appropriate for this patient at this time.  All issues noted in this document were discussed and addressed.  No physical exam was performed (except for noted visual exam findings with Video Visits).   I connected with Kathryn Massey today at  8:30 AM EST by a video enabled telemedicine application and verified that I am speaking with the correct person using two identifiers. Location patient: car Location provider: work  Persons participating in the virtual visit: patient, provider  I discussed the limitations, risks, security and privacy concerns of performing an evaluation and management service by telephone and the availability of in person appointments. I also discussed with the patient that there may be a patient responsible charge related to this service. The patient expressed understanding and agreed to proceed.  Reason for visit: follow-up  HPI: HYPERTENSION  Disease Monitoring  Home BP Monitoring 135/80 though she is not checking this consistently chest pain-no    dyspnea-no Medications  Compliance-taking amlodipine, HCTZ, losartan, and metoprolol.  Edema-no.  DIABETES Disease Monitoring: Blood Sugar ranges-not checking polyuria/phagia/dipsia-no      Optho-up-to-date Medications: Compliance-taking Jardiance and Metformin. Hypoglycemic symptoms-no  Osteopenia: Taking vitamin D daily.  No calcium.  She did have a rib fracture though no other fractures.   ROS: See pertinent positives and negatives per HPI.  Past Medical History:  Diagnosis Date  . Diabetes mellitus without complication (HCC)    borderline  . DVT femoral (deep venous thrombosis) with thrombophlebitis, left ([place]) 2004   had boot on left LE after foot surgery  . Hypertension   . Liver disorder 2010   mild  liver enzyme elevation  . Osteopenia 2016   mild DEXA scores -1.4, -1.5    Past Surgical History:  Procedure Laterality Date  . COLONOSCOPY  2010   benign polyps  . COLONOSCOPY WITH PROPOFOL N/A 09/27/2015   Procedure: COLONOSCOPY WITH PROPOFOL;  Surgeon: Lollie Sails, MD;  Location: [facility] ENDOSCOPY;  Service: Endoscopy;  Laterality: N/A;  . FOOT SURGERY Left 2004   Dr. Vickki Muff tendon release for plantar fasciitis  . PUBOVAGINAL SLING  2005   Dr. Amalia Hailey mesh sling for SVI    Family History  Problem Relation Age of Onset  . Hypertension Mother   . Diabetes Mother   . COPD Father   . Hypertension Father   . Cirrhosis Father 56       deceased  . Heart disease Brother 50       had bypass surgery  . Diabetes Brother   . Hypertension Brother   . Hyperlipidemia Sister   . Lung cancer Maternal Uncle   . Lung cancer Maternal Grandmother   . Breast cancer Neg Hx     SOCIAL HX: Non-smoker   Current Outpatient Medications:  .  amLODipine (NORVASC) 5 MG tablet, TAKE 1 TABLET BY MOUTH EVERY DAY, Disp: 90 tablet, Rfl: 3 .  atorvastatin (LIPITOR) 40 MG tablet, TAKE 1 TABLET BY MOUTH EVERY DAY, Disp: 90 tablet, Rfl: 3 .  Blood Glucose Monitoring Suppl (ONE TOUCH ULTRA SYSTEM KIT) W/DEVICE KIT, 1 kit by Does not apply route once., Disp: 1 each, Rfl: 0 .  glucose blood (ONE TOUCH ULTRA TEST) test strip, Use as instructed, Disp: 100 each, Rfl: 12 .  hydrochlorothiazide (HYDRODIURIL) 25 MG tablet, TAKE 1 TABLET BY MOUTH  EVERY DAY, Disp: 90 tablet, Rfl: 1 .  JARDIANCE 10 MG TABS tablet, TAKE 1 TABLET BY MOUTH DAILY., Disp: 90 tablet, Rfl: 1 .  losartan (COZAAR) 100 MG tablet, TAKE 1 TABLET BY MOUTH EVERY DAY, Disp: 90 tablet, Rfl: 1 .  metFORMIN (GLUCOPHAGE-XR) 500 MG 24 hr tablet, Take 2 tablets (1,000 mg total) by mouth 2 (two) times daily with a meal., Disp: 360 tablet, Rfl: 2 .  metoprolol succinate (TOPROL-XL) 100 MG 24 hr tablet, TAKE 1 TABLET BY MOUTH EVERY DAY (TAKE WITH OR  IMMEDIATELY FOLLOWING A MEAL), Disp: 90 tablet, Rfl: 3  EXAM:  VITALS per patient if applicable:  GENERAL: alert, oriented, appears well and in no acute distress  HEENT: atraumatic, conjunttiva clear, no obvious abnormalities on inspection of external nose and ears  NECK: normal movements of the head and neck  LUNGS: on inspection no signs of respiratory distress, breathing rate appears normal, no obvious gross SOB, gasping or wheezing  CV: no obvious cyanosis  MS: moves all visible extremities without noticeable abnormality  PSYCH/NEURO: pleasant and cooperative, no obvious depression or anxiety, speech and thought processing grossly intact  ASSESSMENT AND PLAN:  Discussed the following assessment and plan:  Hypertension Slightly above goal though she is not checking consistently.  She will check daily for 1 week and send me her readings.  If above goal we will increase her amlodipine to 10 mg once daily.  She will continue her other medications.  Labs to be completed in 2 months.  Diabetes mellitus type 2 in obese (HCC) Undetermined control.  She will continue her current regimen.  I have asked her to check her CBGs several times a week and send [place] her readings.  A1c in 2 months.  Osteopenia Bone density scan ordered.  She will continue vitamin D.   Orders Placed This Encounter  Procedures  . DG Bone Density    Standing Status:   Future    Standing Expiration Date:   03/04/2021    Order Specific Question:   Reason for Exam (SYMPTOM  OR DIAGNOSIS REQUIRED)    Answer:   postmenopausal estrogen deficiency, osteopenia    Order Specific Question:   Is the patient pregnant?    Answer:   No    Order Specific Question:   Preferred imaging location?    Answer:   [facility]  . Basic Metabolic Panel (BMET)    Standing Status:   Future    Standing Expiration Date:   01/04/2021  . HgB A1c    Standing Status:   Future    Standing Expiration Date:   01/04/2021    Meds ordered  this encounter  Medications  . glucose blood (ONE TOUCH ULTRA TEST) test strip    Sig: Use as instructed    Dispense:  100 each    Refill:  12  . metFORMIN (GLUCOPHAGE-XR) 500 MG 24 hr tablet    Sig: Take 2 tablets (1,000 mg total) by mouth 2 (two) times daily with a meal.    Dispense:  360 tablet    Refill:  2  . metoprolol succinate (TOPROL-XL) 100 MG 24 hr tablet    Sig: TAKE 1 TABLET BY MOUTH EVERY DAY (TAKE WITH OR IMMEDIATELY FOLLOWING A MEAL)    Dispense:  90 tablet    Refill:  3     I discussed the assessment and treatment plan with the patient. The patient was provided an opportunity to ask questions and all were answered. The  patient agreed with the plan and demonstrated an understanding of the instructions.   The patient was advised to call back or seek an in-person evaluation if the symptoms worsen or if the condition fails to improve as anticipated.    Tommi Rumps, MD

## 2020-01-26 ENCOUNTER — Encounter: Payer: Self-pay | Admitting: Family Medicine

## 2020-01-29 NOTE — Telephone Encounter (Signed)
Her mother does not have a DPR in place. We can not speak with this patient regarding her mother.

## 2020-02-17 LAB — HM DIABETES EYE EXAM

## 2020-03-04 ENCOUNTER — Other Ambulatory Visit: Payer: Self-pay

## 2020-03-04 ENCOUNTER — Other Ambulatory Visit (INDEPENDENT_AMBULATORY_CARE_PROVIDER_SITE_OTHER): Payer: BC Managed Care – PPO

## 2020-03-04 DIAGNOSIS — I10 Essential (primary) hypertension: Secondary | ICD-10-CM | POA: Diagnosis not present

## 2020-03-04 DIAGNOSIS — E1169 Type 2 diabetes mellitus with other specified complication: Secondary | ICD-10-CM

## 2020-03-04 DIAGNOSIS — E669 Obesity, unspecified: Secondary | ICD-10-CM | POA: Diagnosis not present

## 2020-03-04 LAB — BASIC METABOLIC PANEL
BUN: 14 mg/dL (ref 6–23)
CO2: 30 mEq/L (ref 19–32)
Calcium: 10.1 mg/dL (ref 8.4–10.5)
Chloride: 99 mEq/L (ref 96–112)
Creatinine, Ser: 0.76 mg/dL (ref 0.40–1.20)
GFR: 78.95 mL/min (ref >60.00)
Glucose, Bld: 201 mg/dL — ABNORMAL HIGH (ref 70–99)
Potassium: 4.1 mEq/L (ref 3.5–5.1)
Sodium: 139 mEq/L (ref 135–145)

## 2020-03-04 LAB — HEMOGLOBIN A1C: Hgb A1c MFr Bld: 6.9 % — ABNORMAL HIGH (ref 4.6–6.5)

## 2020-04-07 ENCOUNTER — Telehealth: Payer: Self-pay | Admitting: Family Medicine

## 2020-04-07 NOTE — Telephone Encounter (Signed)
Please let the patient know that I received a letter from her dentist requesting her that her amlodipine be changed to a different BP medication given an issue she has developed with her gums. She is max dosing of her other BP medications. We would need to consider changing her to spironolactone. If she is ok with this I can send it to her pharmacy. She would need follow-up labs 1 week after starting the spironolactone. Thanks.

## 2020-04-08 NOTE — Telephone Encounter (Signed)
I called and spoke with the patient and informed her that her BP medication would need to be changed to spironolactone and the patient wants to finish the amlodipine first and she will call and schedule a lab appt 1 week after starting. Patient also wanted her appt moved up to see the provider and this was accommodated.  Kathryn Massey,cma

## 2020-04-19 ENCOUNTER — Ambulatory Visit (INDEPENDENT_AMBULATORY_CARE_PROVIDER_SITE_OTHER): Payer: BC Managed Care – PPO | Admitting: Family Medicine

## 2020-04-19 ENCOUNTER — Other Ambulatory Visit: Payer: Self-pay

## 2020-04-19 ENCOUNTER — Encounter: Payer: Self-pay | Admitting: Family Medicine

## 2020-04-19 DIAGNOSIS — E669 Obesity, unspecified: Secondary | ICD-10-CM

## 2020-04-19 DIAGNOSIS — I10 Essential (primary) hypertension: Secondary | ICD-10-CM | POA: Diagnosis not present

## 2020-04-19 DIAGNOSIS — E1169 Type 2 diabetes mellitus with other specified complication: Secondary | ICD-10-CM | POA: Diagnosis not present

## 2020-04-19 NOTE — Progress Notes (Signed)
  Marikay Alar, MD Phone: [phone]  Kathryn Massey is a 54 y.o. female who presents today for f/u.  HYPERTENSION  Disease Monitoring  Home BP Monitoring 117-131/60s-74  Chest pain- no    Dyspnea- no Medications  Compliance-  Taking losartan, amlodipine, HCTZ, metoprolol.  Edema- no Her dentist noted she had a gingival lesion that could be related to her amlodipine and suggested she stop the amlodipine.   DIABETES Disease Monitoring: Blood Sugar ranges-128-130s fasting Polyuria/phagia/dipsia- no      Optho- UTD Medications: Compliance- taking metformin, jardiance Hypoglycemia- no     Social History   Tobacco Use  Smoking Status Never Smoker  Smokeless Tobacco Never Used     ROS see history of present illness  Objective  Physical Exam Vitals:   04/19/20 1543  BP: 130/64  Pulse: (!) 52  Temp: 98 F (36.7 C)  SpO2: 99%    BP Readings from Last 3 Encounters:  04/19/20 130/64  09/11/19 130/70  09/01/19 130/70   Wt Readings from Last 3 Encounters:  04/19/20 173 lb 9.6 oz (78.7 kg)  01/05/20 169 lb (76.7 kg)  09/11/19 174 lb (78.9 kg)    Physical Exam Constitutional:      General: She is not in acute distress.    Appearance: She is not diaphoretic.  Cardiovascular:     Rate and Rhythm: Normal rate and regular rhythm.     Heart sounds: Normal heart sounds.  Pulmonary:     Effort: Pulmonary effort is normal.     Breath sounds: Normal breath sounds.  Musculoskeletal:     Right lower leg: No edema.     Left lower leg: No edema.  Skin:    General: Skin is warm and dry.  Neurological:     Mental Status: She is alert.      Assessment/Plan: Please see individual problem list.  Hypertension Well-controlled.  Given her gingival issues we will have her discontinue the amlodipine.  She will monitor her blood pressure for 2 weeks and then return for nurse BP check.  If BPs are elevated will consider adding on additional medication.  She will  continue her other blood pressure medications.  Diabetes mellitus type 2 in obese (HCC) Adequate control on most recent A1c.  She will continue her Metformin and Jardiance.  Discussed checking occasional 2-hour postprandial CBGs.   No orders of the defined types were placed in this encounter.   No orders of the defined types were placed in this encounter.   This visit occurred during the SARS-CoV-2 public health emergency.  Safety protocols were in place, including screening questions prior to the visit, additional usage of staff PPE, and extensive cleaning of exam room while observing appropriate contact time as indicated for disinfecting solutions.    Marikay Alar, MD [facility]

## 2020-04-19 NOTE — Patient Instructions (Signed)
Nice to see you. Please stop your amlodipine.  Please keep an eye on your blood pressure daily for the next 2 weeks.

## 2020-04-19 NOTE — Assessment & Plan Note (Signed)
Adequate control on most recent A1c.  She will continue her Metformin and Jardiance.  Discussed checking occasional 2-hour postprandial CBGs.

## 2020-04-19 NOTE — Assessment & Plan Note (Signed)
Well-controlled.  Given her gingival issues we will have her discontinue the amlodipine.  She will monitor her blood pressure for 2 weeks and then return for nurse BP check.  If BPs are elevated will consider adding on additional medication.  She will continue her other blood pressure medications.

## 2020-05-04 ENCOUNTER — Ambulatory Visit (INDEPENDENT_AMBULATORY_CARE_PROVIDER_SITE_OTHER): Payer: BC Managed Care – PPO

## 2020-05-04 ENCOUNTER — Other Ambulatory Visit: Payer: Self-pay

## 2020-05-04 VITALS — BP 124/68 | HR 60

## 2020-05-04 DIAGNOSIS — I10 Essential (primary) hypertension: Secondary | ICD-10-CM

## 2020-05-04 NOTE — Progress Notes (Signed)
Patient is here for a BP check due to  bp medication allergic reaction, as per patient.  Currently patients BP is 124/68 and BPM is 60, ten minutes prior BP was 148/80, BPM was 69.  Patient has no complaints of headaches, blurry vision, chest pain, arm pain, light headedness, dizziness, and nor jaw pain. Please see previous note for order.

## 2020-05-05 ENCOUNTER — Telehealth: Payer: Self-pay

## 2020-05-05 NOTE — Telephone Encounter (Signed)
Patient is wiling to accept the change to coreg and she scheduled to see you in one month.  Dyna Figuereo,cma

## 2020-05-05 NOTE — Telephone Encounter (Signed)
Her home systolic BPs are still occasionally elevated. I would suggest we change her metoprolol to coreg and see if that provides better BP benefit. I can send this in once you speak with her. She would need a BP follow-up with me in the office about a month after making this change. Thanks.

## 2020-05-05 NOTE — Progress Notes (Signed)
I called and spoke with the patient and informed her to take BP readings at home for 2 weeks and send the readings through mychart to the provider. Patient understood.  Jonalyn Sedlak,cma

## 2020-05-06 MED ORDER — CARVEDILOL 25 MG PO TABS: 25.0000 mg | ORAL_TABLET | Freq: Two times a day (BID) | ORAL | 3 refills | Status: DC

## 2020-05-06 NOTE — Telephone Encounter (Signed)
I called and spoke with the patient and informed her that her medication coreg was sent to the pharmacy and she is to start it 24 hours after stopping the topol and if her heart rate drops <60 to let us know. She understood.  Kalven Ganim,cma

## 2020-05-06 NOTE — Telephone Encounter (Signed)
Coreg sent to pharmacy. She will start this 24 hours after stopping the toprol. If she notices fatigue or heart rate <60 she needs to let us know.

## 2020-05-24 ENCOUNTER — Encounter: Payer: Self-pay | Admitting: Family Medicine

## 2020-06-02 ENCOUNTER — Ambulatory Visit: Payer: BC Managed Care – PPO | Admitting: Family Medicine

## 2020-06-08 ENCOUNTER — Other Ambulatory Visit: Payer: Self-pay | Admitting: Family Medicine

## 2020-06-14 ENCOUNTER — Ambulatory Visit: Payer: BC Managed Care – PPO | Admitting: Family Medicine

## 2020-06-23 ENCOUNTER — Other Ambulatory Visit: Payer: Self-pay | Admitting: Family Medicine

## 2020-06-28 ENCOUNTER — Other Ambulatory Visit: Payer: Self-pay

## 2020-06-28 ENCOUNTER — Ambulatory Visit (INDEPENDENT_AMBULATORY_CARE_PROVIDER_SITE_OTHER): Payer: BC Managed Care – PPO | Admitting: Family Medicine

## 2020-06-28 ENCOUNTER — Encounter: Payer: Self-pay | Admitting: Family Medicine

## 2020-06-28 DIAGNOSIS — I10 Essential (primary) hypertension: Secondary | ICD-10-CM | POA: Diagnosis not present

## 2020-06-28 DIAGNOSIS — E1169 Type 2 diabetes mellitus with other specified complication: Secondary | ICD-10-CM | POA: Diagnosis not present

## 2020-06-28 DIAGNOSIS — E669 Obesity, unspecified: Secondary | ICD-10-CM | POA: Diagnosis not present

## 2020-06-28 DIAGNOSIS — E785 Hyperlipidemia, unspecified: Secondary | ICD-10-CM | POA: Diagnosis not present

## 2020-06-28 LAB — COMPREHENSIVE METABOLIC PANEL
ALT: 22 U/L (ref 0–35)
AST: 19 U/L (ref 0–37)
Albumin: 4.7 g/dL (ref 3.5–5.2)
Alkaline Phosphatase: 58 U/L (ref 39–117)
BUN: 15 mg/dL (ref 6–23)
CO2: 31 mEq/L (ref 19–32)
Calcium: 10.4 mg/dL (ref 8.4–10.5)
Chloride: 102 mEq/L (ref 96–112)
Creatinine, Ser: 0.78 mg/dL (ref 0.40–1.20)
GFR: 76.53 mL/min (ref >60.00)
Glucose, Bld: 109 mg/dL — ABNORMAL HIGH (ref 70–99)
Potassium: 4.5 mEq/L (ref 3.5–5.1)
Sodium: 141 mEq/L (ref 135–145)
Total Bilirubin: 0.5 mg/dL (ref 0.2–1.2)
Total Protein: 7.1 g/dL (ref 6.0–8.3)

## 2020-06-28 LAB — LIPID PANEL
Cholesterol: 129 mg/dL (ref 0–200)
HDL: 47 mg/dL (ref >39.00)
NonHDL: 81.66
Total CHOL/HDL Ratio: 3
Triglycerides: 204 mg/dL — ABNORMAL HIGH (ref 0.0–149.0)
VLDL: 40.8 mg/dL — ABNORMAL HIGH (ref 0.0–40.0)

## 2020-06-28 LAB — LDL CHOLESTEROL, DIRECT: Direct LDL: 57 mg/dL

## 2020-06-28 LAB — HEMOGLOBIN A1C: Hgb A1c MFr Bld: 7.3 % — ABNORMAL HIGH (ref 4.6–6.5)

## 2020-06-28 NOTE — Assessment & Plan Note (Signed)
Check A1c.  Continue current regimen. 

## 2020-06-28 NOTE — Assessment & Plan Note (Signed)
Check lipid panel  

## 2020-06-28 NOTE — Progress Notes (Signed)
Tommi Rumps, MD Phone: [phone]  Kathryn Massey is a 54 y.o. female who presents today for f/u.  HYPERTENSION  Disease Monitoring  Home BP Monitoring 124-132/68-77 Chest pain- no    Dyspnea- no Medications  Compliance-  Taking coreg, HCTZ, losartan.   Edema- no  DIABETES Disease Monitoring: Blood Sugar ranges-120-164 Polyuria/phagia/dipsia- no      Optho- UTD Medications: Compliance- taking jardiance, metformin Hypoglycemic symptoms- no     Social History   Tobacco Use  Smoking Status Never Smoker  Smokeless Tobacco Never Used     ROS see history of present illness  Objective  Physical Exam Vitals:   06/28/20 1345  BP: (!) 130/70  Pulse: 80  Temp: 98.3 F (36.8 C)  SpO2: 96%    BP Readings from Last 3 Encounters:  06/28/20 (!) 130/70  05/04/20 124/68  04/19/20 130/64   Wt Readings from Last 3 Encounters:  06/28/20 171 lb 6.4 oz (77.7 kg)  04/19/20 173 lb 9.6 oz (78.7 kg)  01/05/20 169 lb (76.7 kg)    Physical Exam Constitutional:      General: She is not in acute distress.    Appearance: She is not diaphoretic.  Cardiovascular:     Rate and Rhythm: Normal rate and regular rhythm.     Heart sounds: Normal heart sounds.  Pulmonary:     Effort: Pulmonary effort is normal.     Breath sounds: Normal breath sounds.  Skin:    General: Skin is warm and dry.  Neurological:     Mental Status: She is alert.      Assessment/Plan: Please see individual problem list.  Hypertension Slightly above goal.  I will have her continue her current regimen we will check lab work.  Consider increasing Jardiance for her diabetes as it may provide some blood pressure benefit.  Diabetes mellitus type 2 in obese (HCC) Check A1c. Continue current regimen.   Hyperlipidemia Check lipid panel.    Orders Placed This Encounter  Procedures   Comp Met (CMET)   Lipid panel   HgB A1c    No orders of the defined types were placed in this  encounter.   This visit occurred during the SARS-CoV-2 public health emergency.  Safety protocols were in place, including screening questions prior to the visit, additional usage of staff PPE, and extensive cleaning of exam room while observing appropriate contact time as indicated for disinfecting solutions.    Tommi Rumps, MD [place]

## 2020-06-28 NOTE — Patient Instructions (Signed)
Nice to see you. We will check lab work today. 

## 2020-06-28 NOTE — Assessment & Plan Note (Signed)
Slightly above goal.  I will have her continue her current regimen we will check lab work.  Consider increasing Jardiance for her diabetes as it may provide some blood pressure benefit.

## 2020-07-01 ENCOUNTER — Other Ambulatory Visit: Payer: Self-pay | Admitting: Family Medicine

## 2020-07-01 MED ORDER — EMPAGLIFLOZIN 25 MG PO TABS: 25.0000 mg | ORAL_TABLET | Freq: Every day | ORAL | 1 refills | Status: DC

## 2020-07-21 ENCOUNTER — Ambulatory Visit: Payer: BC Managed Care – PPO | Admitting: Family Medicine

## 2020-07-30 ENCOUNTER — Other Ambulatory Visit: Payer: Self-pay | Admitting: Family Medicine

## 2020-09-13 ENCOUNTER — Ambulatory Visit (INDEPENDENT_AMBULATORY_CARE_PROVIDER_SITE_OTHER): Payer: BC Managed Care – PPO | Admitting: Obstetrics

## 2020-09-13 ENCOUNTER — Encounter: Payer: Self-pay | Admitting: Obstetrics

## 2020-09-13 ENCOUNTER — Other Ambulatory Visit (HOSPITAL_COMMUNITY)
Admission: RE | Admit: 2020-09-13 | Discharge: 2020-09-13 | Disposition: A | Discharge status: Home / Self Care | Payer: BC Managed Care – PPO | Source: Ambulatory Visit | Attending: Obstetrics | Admitting: Obstetrics

## 2020-09-13 ENCOUNTER — Other Ambulatory Visit: Payer: Self-pay

## 2020-09-13 VITALS — BP 140/70 | HR 78 | Ht 63.0 in | Wt 171.0 lb

## 2020-09-13 DIAGNOSIS — Z01419 Encounter for gynecological examination (general) (routine) without abnormal findings: Secondary | ICD-10-CM | POA: Insufficient documentation

## 2020-09-13 DIAGNOSIS — Z124 Encounter for screening for malignant neoplasm of cervix: Secondary | ICD-10-CM | POA: Insufficient documentation

## 2020-09-13 NOTE — Progress Notes (Signed)
Routine Annual Gynecology Examination   PCP: Leone Haven, MD  Chief Complaint:  Chief Complaint  Patient presents with  . Gynecologic Exam    History of Present Illness: Patient is a 54 y.o. G3P3003 presents for annual exam. The patient has no complaints today.   Menopausal bleeding: denies  Menopausal symptoms: denies  Breast symptoms: denies  Last pap smear: 3 years ago.  Result Normal  Last mammogram: 1 years ago.  Result Normal  Past Medical History:  Diagnosis Date  . Diabetes mellitus without complication (HCC)    borderline  . DVT femoral (deep venous thrombosis) with thrombophlebitis, left ([place]) 2004   had boot on left LE after foot surgery  . Hypertension   . Liver disorder 2010   mild liver enzyme elevation  . Osteopenia 2016   mild DEXA scores -1.4, -1.5    Past Surgical History:  Procedure Laterality Date  . COLONOSCOPY  2010   benign polyps  . COLONOSCOPY WITH PROPOFOL N/A 09/27/2015   Procedure: COLONOSCOPY WITH PROPOFOL;  Surgeon: Lollie Sails, MD;  Location: [facility] ENDOSCOPY;  Service: Endoscopy;  Laterality: N/A;  . FOOT SURGERY Left 2004   Dr. Vickki Muff tendon release for plantar fasciitis  . PUBOVAGINAL SLING  2005   Dr. Amalia Hailey mesh sling for SVI    Prior to Admission medications   Medication Sig Start Date End Date Taking? Authorizing Provider  atorvastatin (LIPITOR) 40 MG tablet TAKE 1 TABLET BY MOUTH EVERY DAY 10/23/19  Yes Leone Haven, MD  Blood Glucose Monitoring Suppl (ONE TOUCH ULTRA SYSTEM KIT) W/DEVICE KIT 1 kit by Does not apply route once. 11/21/12  Yes Jackolyn Confer, MD  carvedilol (COREG) 25 MG tablet TAKE 1 TABLET (25 MG TOTAL) BY MOUTH 2 (TWO) TIMES DAILY WITH A MEAL. 07/30/20  Yes Leone Haven, MD  empagliflozin (JARDIANCE) 25 MG TABS tablet Take 1 tablet (25 mg total) by mouth daily. 07/01/20  Yes Leone Haven, MD  glucose blood (ONE TOUCH ULTRA TEST) test strip Use as instructed 01/05/20  Yes  Leone Haven, MD  hydrochlorothiazide (HYDRODIURIL) 25 MG tablet TAKE 1 TABLET BY MOUTH EVERY DAY 06/23/20  Yes Leone Haven, MD  losartan (COZAAR) 100 MG tablet TAKE 1 TABLET BY MOUTH EVERY DAY 06/23/20  Yes Leone Haven, MD  metFORMIN (GLUCOPHAGE-XR) 500 MG 24 hr tablet Take 2 tablets (1,000 mg total) by mouth 2 (two) times daily with a meal. 01/05/20  Yes Leone Haven, MD    No Known Allergies  Gynecologic History:  No LMP recorded (lmp unknown). Patient is postmenopausal. Contraception: post menopausal status Last Pap: 2019. Results were: normal Last mammogram: 2020. Results were: normal  Obstetric History: G3P3003  Social History   Socioeconomic History  . Marital status: Married    Spouse name: Richard Personnel officer"  . Number of children: 3  . Years of education: 26  . Highest education level: Not on file  Occupational History  . 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 Smoking status: Never Smoker  . Smokeless tobacco: Never Used  Vaping Use  . Vaping Use: Never used  Substance and Sexual Activity  . Alcohol use: Yes    Comment: rarely  . Drug use: No  . Sexual activity: Yes    Partners: Male    Birth control/protection: Post-menopausal  Other Topics Concern  . Not on file  Social History Narrative   Lives in [place] with husband and  and  son.     Work - Conservation officer, historic buildings, nutritionist   Diet - Healthy   Exercise - working/ cleaning at work since [employer]:   . Difficulty of Paying Living Expenses: Not on file  Food Insecurity:   . Worried About Charity fundraiser in the Last Year: Not on file  . Ran Out of Food in the Last Year: Not on file  Transportation Needs:   . Lack of Transportation (Medical): Not on file  . Lack of Transportation (Non-Medical): Not on file  Physical Activity:   . Days of Exercise per Week: Not on file  . Minutes of Exercise per Session: Not on  file  Stress:   . Feeling of Stress : Not on file  Social Connections:   . Frequency of Communication with Friends and Family: Not on file  . Frequency of Social Gatherings with Friends and Family: Not on file  . Attends Religious Services: Not on file  . Active Member of Clubs or Organizations: Not on file  . Attends Archivist Meetings: Not on file  . Marital Status: Not on file  Intimate Partner Violence:   . Fear of Current or Ex-Partner: Not on file  . Emotionally Abused: Not on file  . Physically Abused: Not on file  . Sexually Abused: Not on file    Family History  Problem Relation Age of Onset  . Hypertension Mother   . Diabetes Mother   . COPD Father   . Hypertension Father   . Cirrhosis Father 67       deceased  . Heart disease Brother 55       had bypass surgery  . Diabetes Brother   . Hypertension Brother   . Hyperlipidemia Sister   . Lung cancer Maternal Uncle   . Lung cancer Maternal Grandmother   . Breast cancer Neg Hx     Review of Systems  Constitutional: Negative.   HENT: Negative.   Eyes: Negative.   Respiratory: Negative.   Cardiovascular: Negative.   Gastrointestinal: Negative.   Genitourinary: Negative.   Musculoskeletal: Negative.   Skin: Negative.   Neurological: Negative.   Endo/Heme/Allergies: Negative.   Psychiatric/Behavioral: Negative.   All other systems reviewed and are negative.    Physical Exam Vitals: BP 140/70   Pulse 78   Ht '5\' 3"'  (1.6 m)   Wt 171 lb (77.6 kg)   LMP  (LMP Unknown)   BMI 30.29 kg/m   Physical Exam Constitutional:      Appearance: Normal appearance. She is obese.  Genitourinary:     Vulva normal.     Genitourinary Comments: Several epidermal cysts noted on labia. Hair normally distributed. No rashes or lesions or vaginal discharge. Uterus is anteverted, non enlarged. No adnexal masses noted.  HENT:     Head: Normocephalic and atraumatic.  Cardiovascular:     Rate and Rhythm: Normal rate  and regular rhythm.     Pulses: Normal pulses.     Heart sounds: Normal heart sounds.  Pulmonary:     Effort: Pulmonary effort is normal.     Breath sounds: Normal breath sounds.  Abdominal:     General: Bowel sounds are normal.     Palpations: Abdomen is soft.  Musculoskeletal:        General: Normal range of motion.     Cervical back: Normal range of motion and neck supple.  Neurological:  General: No focal deficit present.     Mental Status: She is alert and oriented to person, place, and time.  Skin:    General: Skin is warm and dry.  Psychiatric:        Mood and Affect: Mood normal.        Behavior: Behavior normal.      Female chaperone present for pelvic and breast  portions of the physical exam  Results: Denis any depressive sxs or anxiety   Assessment and Plan:  54 y.o. G34P3003 female here for routine annual gynecologic examination  Plan: Problem List Items Addressed This Visit    None    Visit Diagnoses    Women's annual routine gynecological examination    -  Primary   Relevant Orders   Cytology - PAP   Cervical cancer screening       Relevant Orders   Cytology - PAP      Screening: -- Blood pressure screen managed by PCP -- Colonoscopy - not due -- Mammogram - due. Patient to call [place] to arrange. She understands that it is her responsibility to arrange this. -- Weight screening: obese: discussed management options, including lifestyle, dietary, and exercise. -- Depression screening negative (PHQ-9) -- Nutrition: normal -- cholesterol screening: per PCP -- osteoporosis screening: not due -- tobacco screening: not using -- alcohol screening: AUDIT questionnaire indicates low-risk usage. -- family history of breast cancer screening: done. not at high risk. -- no evidence of domestic violence or intimate partner violence. -- STD screening: gonorrhea/chlamydia NAAT not collected per patient request. -- pap smear collected per ASCCP  guidelines -- flu vaccine managed by her PCP -- HPV vaccination series: has not received - pt refuses   RTC in one year for next annual. Will proceed with pap smears q 3 years.  Imagene Riches, CNM  09/13/2020 3:27 PM      09/13/2020 3:21 PM

## 2020-09-15 LAB — CYTOLOGY - PAP
Comment: NEGATIVE
Diagnosis: NEGATIVE
High risk HPV: NEGATIVE

## 2020-09-17 ENCOUNTER — Encounter: Payer: Self-pay | Admitting: Obstetrics

## 2020-09-29 ENCOUNTER — Other Ambulatory Visit: Payer: Self-pay

## 2020-09-29 ENCOUNTER — Encounter: Payer: Self-pay | Admitting: Family Medicine

## 2020-09-29 ENCOUNTER — Ambulatory Visit (INDEPENDENT_AMBULATORY_CARE_PROVIDER_SITE_OTHER): Payer: BC Managed Care – PPO | Admitting: Family Medicine

## 2020-09-29 VITALS — BP 118/78 | HR 68 | Temp 98.4°F | Ht 63.0 in | Wt 166.8 lb

## 2020-09-29 DIAGNOSIS — E1169 Type 2 diabetes mellitus with other specified complication: Secondary | ICD-10-CM

## 2020-09-29 DIAGNOSIS — E669 Obesity, unspecified: Secondary | ICD-10-CM

## 2020-09-29 DIAGNOSIS — I10 Essential (primary) hypertension: Secondary | ICD-10-CM

## 2020-09-29 DIAGNOSIS — Z1159 Encounter for screening for other viral diseases: Secondary | ICD-10-CM

## 2020-09-29 DIAGNOSIS — Z23 Encounter for immunization: Secondary | ICD-10-CM | POA: Diagnosis not present

## 2020-09-29 LAB — HEMOGLOBIN A1C: Hgb A1c MFr Bld: 7.2 % — ABNORMAL HIGH (ref 4.6–6.5)

## 2020-09-29 NOTE — Patient Instructions (Addendum)
Nice to see you. Please contact your podiatrist for your foot. We will get lab work today. You can go ahead and get the Covid vaccine booster.  [place]

## 2020-09-29 NOTE — Assessment & Plan Note (Addendum)
Check A1c.  Continue Jardiance 25 mg daily and Metformin 1000 mg twice daily.  She will contact her podiatrist to evaluate the callus on her foot.

## 2020-09-29 NOTE — Progress Notes (Signed)
  Marikay Alar, MD Phone: [phone]  Kathryn Massey is a 54 y.o. female who presents today for f/u.  DIABETES Disease Monitoring: Blood Sugar ranges-118 fasting x1, 122 two hours post prandial x1  Polyuria/phagia/dipsia- no      Optho- UTD Medications: Compliance- taking jardiance and metformin Hypoglycemic symptoms- no  HYPERTENSION  Disease Monitoring  Home BP Monitoring not checking Chest pain- no    Dyspnea- no Medications  Compliance-  Taking coreg, HCTZ, losartan.   Edema- no     Social History   Tobacco Use  Smoking Status Never Smoker  Smokeless Tobacco Never Used     ROS see history of present illness  Objective  Physical Exam Vitals:   09/29/20 1316  BP: 118/78  Pulse: 68  Temp: 98.4 F (36.9 C)  SpO2: 97%    BP Readings from Last 3 Encounters:  09/29/20 118/78  09/13/20 140/70  06/28/20 (!) 130/70   Wt Readings from Last 3 Encounters:  09/29/20 166 lb 12.8 oz (75.7 kg)  09/13/20 171 lb (77.6 kg)  06/28/20 171 lb 6.4 oz (77.7 kg)    Physical Exam Constitutional:      General: She is not in acute distress.    Appearance: She is not diaphoretic.  Cardiovascular:     Rate and Rhythm: Normal rate and regular rhythm.     Heart sounds: Normal heart sounds.  Pulmonary:     Effort: Pulmonary effort is normal.     Breath sounds: Normal breath sounds.  Musculoskeletal:     Right lower leg: No edema.     Left lower leg: No edema.  Skin:    General: Skin is warm and dry.  Neurological:     Mental Status: She is alert.    Diabetic Foot Exam - Simple   Simple Foot Form Diabetic Foot exam was performed with the following findings: Yes 09/29/2020  1:39 PM  Visual Inspection See comments: Yes Sensation Testing Intact to touch and monofilament testing bilaterally: Yes Pulse Check Posterior Tibialis and Dorsalis pulse intact bilaterally: Yes Comments Small callus noted on the plantar surface of the ball of her left foot, otherwise  no deformities, ulcerations, or skin breakdown      Assessment/Plan: Please see individual problem list.  Problem List Items Addressed This Visit    Diabetes mellitus type 2 in obese (HCC) (Chronic)    Check A1c.  Continue Jardiance 25 mg daily and Metformin 1000 mg twice daily.  She will contact her podiatrist to evaluate the callus on her foot.      Relevant Orders   HgB A1c   Hypertension (Chronic)    At goal.  Continue current regimen of carvedilol 25 mg twice daily, HCTZ 25 mg daily, and losartan 100 mg daily.       Other Visit Diagnoses    Need for hepatitis C screening test    -  Primary   Relevant Orders   Hepatitis C Antibody       Health Maintenance: Check hepatitis C antibody for screening.   This visit occurred during the SARS-CoV-2 public health emergency.  Safety protocols were in place, including screening questions prior to the visit, additional usage of staff PPE, and extensive cleaning of exam room while observing appropriate contact time as indicated for disinfecting solutions.    Marikay Alar, MD [facility]

## 2020-09-29 NOTE — Assessment & Plan Note (Signed)
At goal.  Continue current regimen of carvedilol 25 mg twice daily, HCTZ 25 mg daily, and losartan 100 mg daily.

## 2020-09-30 LAB — HEPATITIS C ANTIBODY
Hepatitis C Ab: NONREACTIVE
SIGNAL TO CUT-OFF: 0.01 (ref <1.00)

## 2020-10-12 ENCOUNTER — Other Ambulatory Visit: Payer: Self-pay | Admitting: Family Medicine

## 2020-10-12 IMAGING — MG MM DIGITAL SCREENING BILAT W/ TOMO W/ CAD
8 series · 8 of 24 positions shown · non-contrast
Comparison: Previous exam(s).

CLINICAL DATA: Screening.

EXAM:
DIGITAL SCREENING BILATERAL MAMMOGRAM WITH TOMO AND CAD

[R MLO synth-2D]
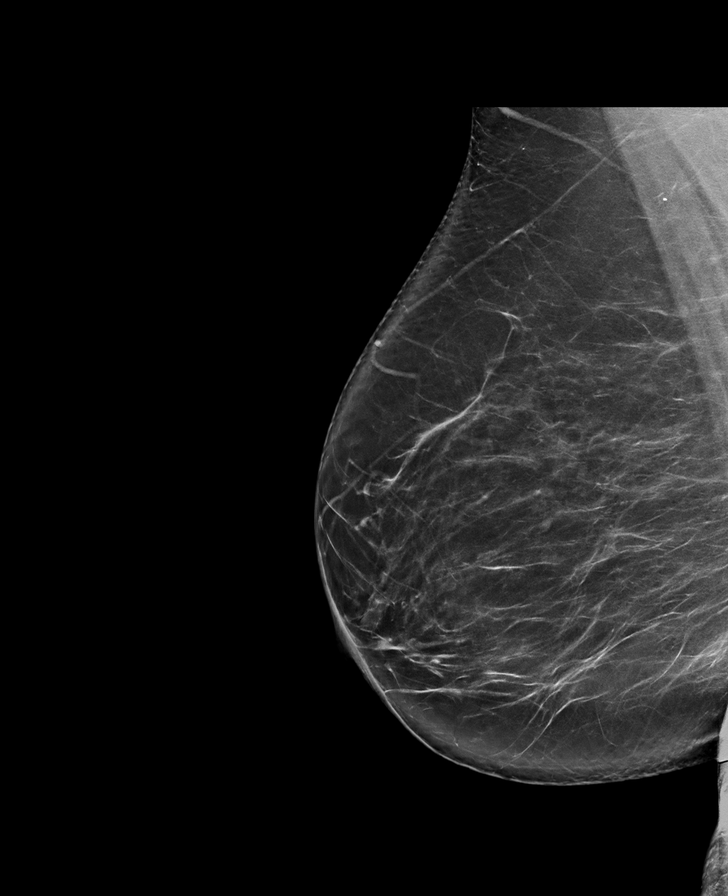

[L CC synth-2D]
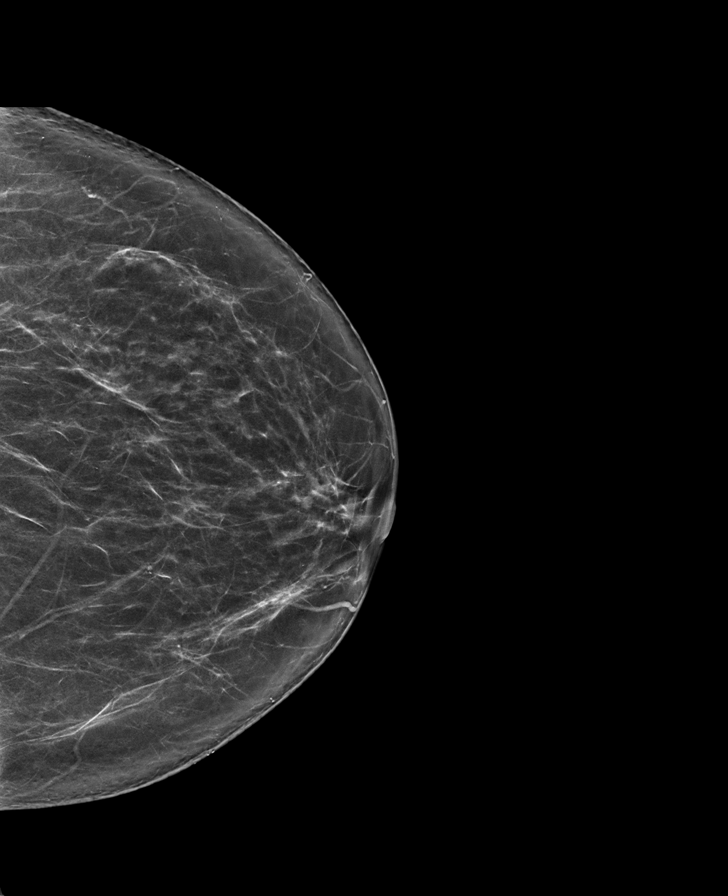

[R CC synth-2D]
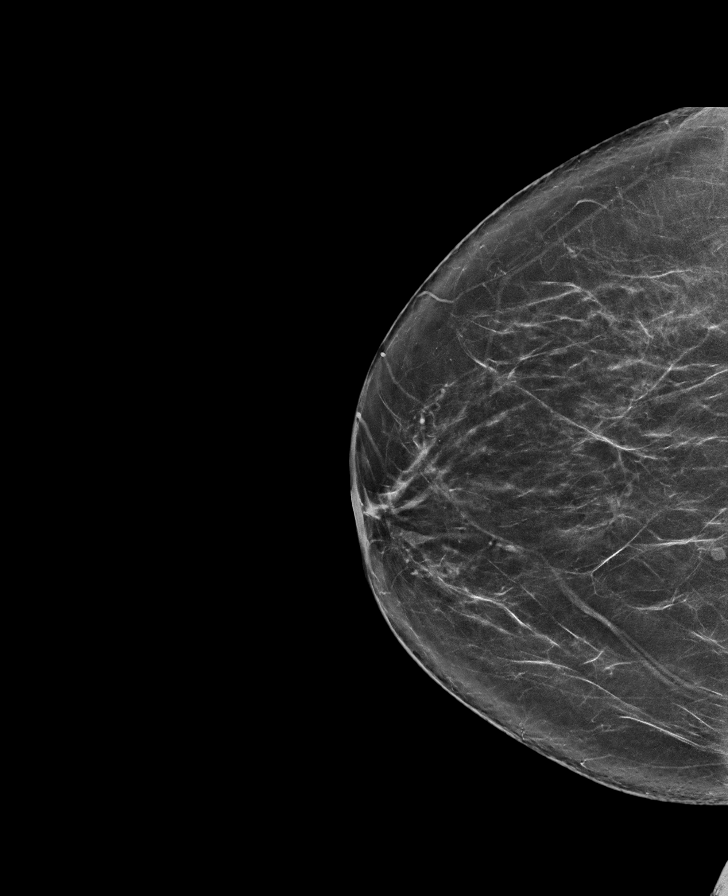

[L MLO synth-2D]
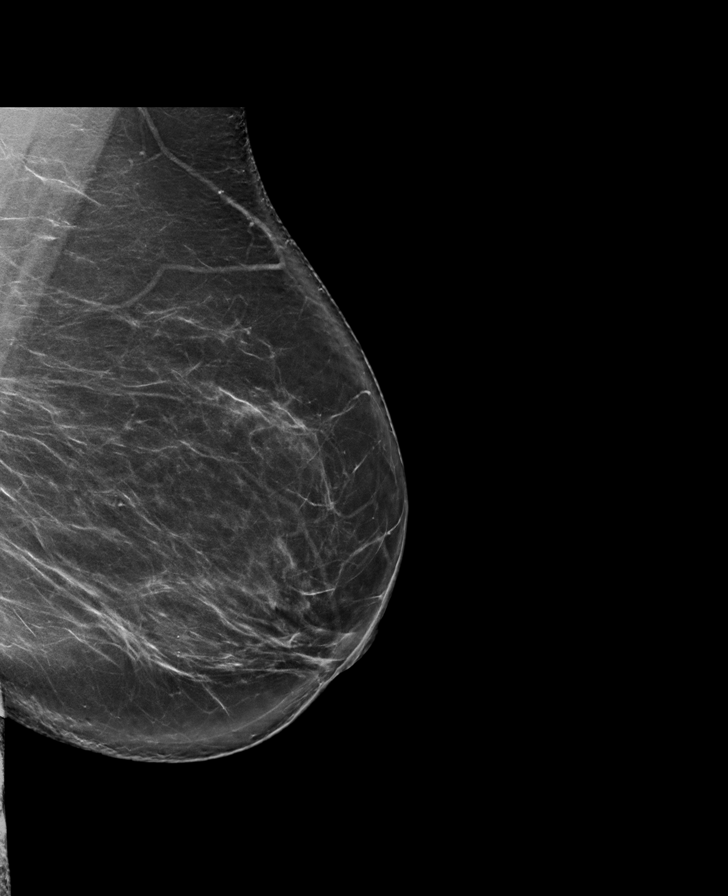

[L CC tomo · tomo slice 39/78.0]
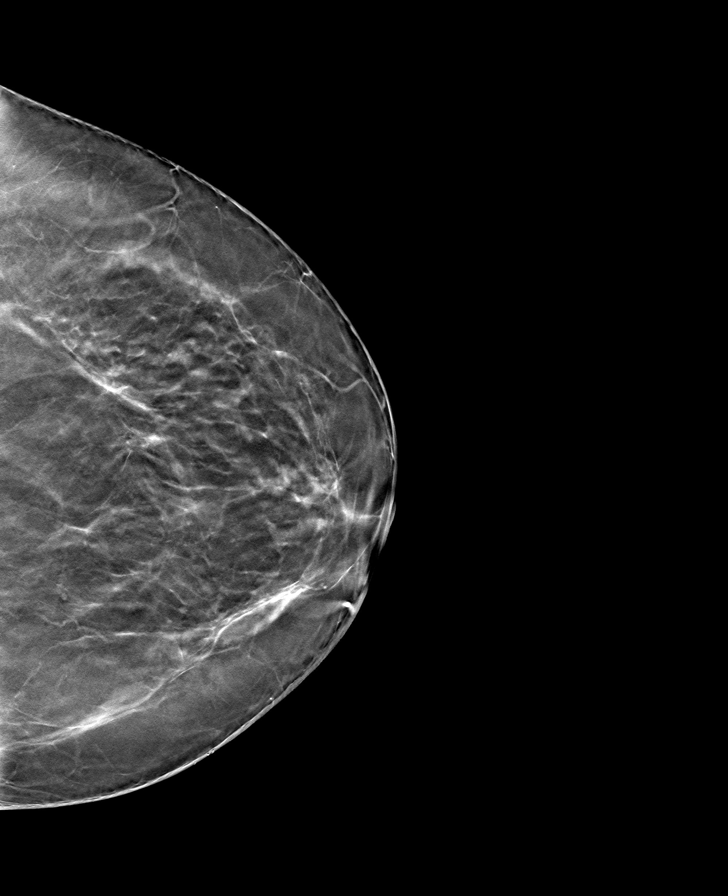

[R CC tomo · tomo slice 40/79.0]
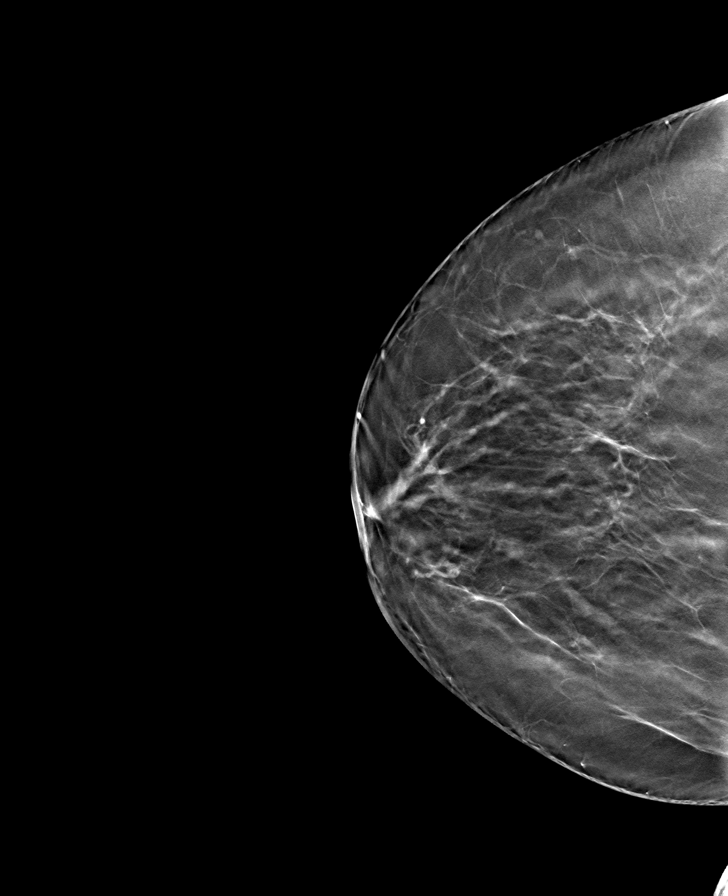

[L MLO tomo · tomo slice 45/90.0]
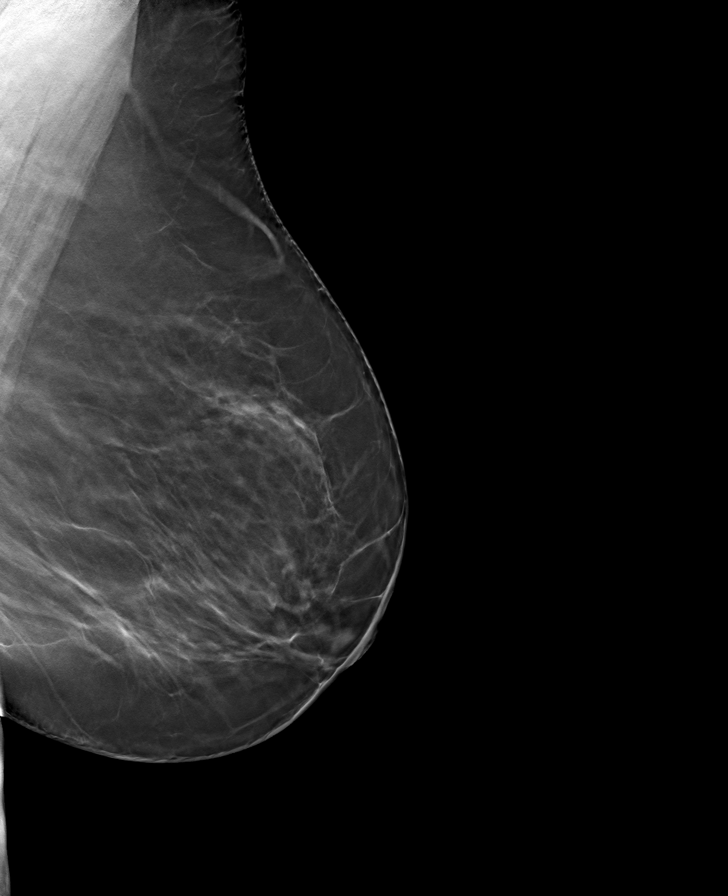

[R MLO tomo · tomo slice 45/89.0]
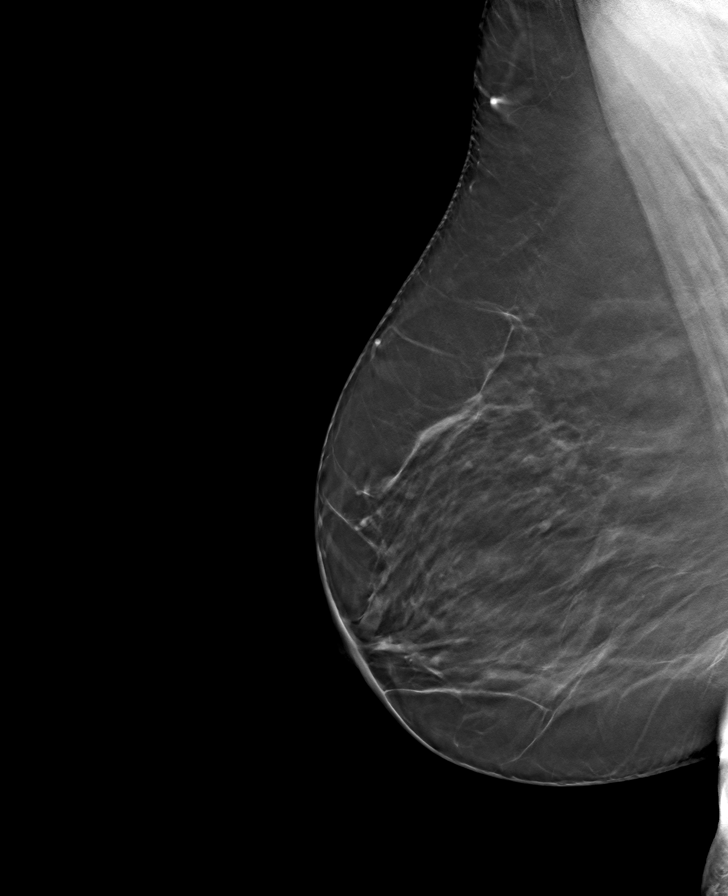

[8 of 24 positions shown; findings below may reference images not displayed]

ACR Breast Density Category b: There are scattered areas of
fibroglandular density.
FINDINGS: There are no findings suspicious for malignancy. Images were
processed with CAD.
IMPRESSION: No mammographic evidence of malignancy. A result letter of this
screening mammogram will be mailed directly to the patient.

RECOMMENDATION:
Screening mammogram in one year. (Code:[id number])

BI-RADS CATEGORY  1: Negative.

## 2020-10-15 ENCOUNTER — Other Ambulatory Visit: Payer: Self-pay | Admitting: Family Medicine

## 2020-10-18 ENCOUNTER — Other Ambulatory Visit: Payer: Self-pay

## 2020-10-18 ENCOUNTER — Ambulatory Visit
Admission: RE | Admit: 2020-10-18 | Discharge: 2020-10-18 | Disposition: A | Discharge status: Home / Self Care | Payer: BC Managed Care – PPO | Source: Ambulatory Visit | Attending: Obstetrics | Admitting: Obstetrics

## 2020-10-18 DIAGNOSIS — Z01419 Encounter for gynecological examination (general) (routine) without abnormal findings: Secondary | ICD-10-CM | POA: Insufficient documentation

## 2020-10-18 DIAGNOSIS — Z1231 Encounter for screening mammogram for malignant neoplasm of breast: Secondary | ICD-10-CM | POA: Diagnosis present

## 2020-10-20 ENCOUNTER — Other Ambulatory Visit: Payer: Self-pay | Admitting: Obstetrics

## 2020-10-20 DIAGNOSIS — N6310 Unspecified lump in the right breast, unspecified quadrant: Secondary | ICD-10-CM

## 2020-10-20 DIAGNOSIS — R928 Other abnormal and inconclusive findings on diagnostic imaging of breast: Secondary | ICD-10-CM

## 2020-10-20 NOTE — Progress Notes (Signed)
Called the patient after reviewing her mammogram report. She has not yet been called by the radiology office and did not know about the mammogram findings. We reviewed the report together over the phone. She expects to hear from them within a few days to set up a diagnostic mammogram. I instructed her to contact me if she does not hear from them within a week.

## 2020-11-05 ENCOUNTER — Ambulatory Visit
Admission: RE | Admit: 2020-11-05 | Discharge: 2020-11-05 | Disposition: A | Discharge status: Home / Self Care | Payer: BC Managed Care – PPO | Source: Ambulatory Visit | Attending: Obstetrics | Admitting: Obstetrics

## 2020-11-05 ENCOUNTER — Other Ambulatory Visit: Payer: Self-pay

## 2020-11-05 DIAGNOSIS — N6310 Unspecified lump in the right breast, unspecified quadrant: Secondary | ICD-10-CM | POA: Insufficient documentation

## 2020-11-05 DIAGNOSIS — R928 Other abnormal and inconclusive findings on diagnostic imaging of breast: Secondary | ICD-10-CM

## 2020-12-17 ENCOUNTER — Other Ambulatory Visit: Payer: Self-pay | Admitting: Family Medicine

## 2020-12-24 ENCOUNTER — Other Ambulatory Visit: Payer: Self-pay | Admitting: Family Medicine

## 2020-12-31 ENCOUNTER — Ambulatory Visit: Payer: BC Managed Care – PPO | Admitting: Family Medicine

## 2021-01-19 ENCOUNTER — Other Ambulatory Visit: Payer: Self-pay

## 2021-01-20 ENCOUNTER — Ambulatory Visit: Payer: Self-pay | Admitting: Family Medicine

## 2021-01-25 ENCOUNTER — Other Ambulatory Visit: Payer: Self-pay | Admitting: Family Medicine

## 2021-02-28 ENCOUNTER — Other Ambulatory Visit: Payer: Self-pay

## 2021-03-02 ENCOUNTER — Ambulatory Visit: Payer: BC Managed Care – PPO | Admitting: Family Medicine

## 2021-03-02 ENCOUNTER — Encounter: Payer: Self-pay | Admitting: Family Medicine

## 2021-03-02 ENCOUNTER — Other Ambulatory Visit: Payer: Self-pay

## 2021-03-02 ENCOUNTER — Ambulatory Visit (INDEPENDENT_AMBULATORY_CARE_PROVIDER_SITE_OTHER): Payer: BC Managed Care – PPO

## 2021-03-02 DIAGNOSIS — M25521 Pain in right elbow: Secondary | ICD-10-CM | POA: Diagnosis not present

## 2021-03-02 DIAGNOSIS — E669 Obesity, unspecified: Secondary | ICD-10-CM | POA: Diagnosis not present

## 2021-03-02 DIAGNOSIS — F4321 Adjustment disorder with depressed mood: Secondary | ICD-10-CM | POA: Diagnosis not present

## 2021-03-02 DIAGNOSIS — E1169 Type 2 diabetes mellitus with other specified complication: Secondary | ICD-10-CM | POA: Diagnosis not present

## 2021-03-02 DIAGNOSIS — I10 Essential (primary) hypertension: Secondary | ICD-10-CM | POA: Diagnosis not present

## 2021-03-02 LAB — HEMOGLOBIN A1C: Hgb A1c MFr Bld: 6.9 % — ABNORMAL HIGH (ref 4.6–6.5)

## 2021-03-02 LAB — BASIC METABOLIC PANEL
BUN: 16 mg/dL (ref 6–23)
CO2: 32 mEq/L (ref 19–32)
Calcium: 10.3 mg/dL (ref 8.4–10.5)
Chloride: 98 mEq/L (ref 96–112)
Creatinine, Ser: 0.66 mg/dL (ref 0.40–1.20)
GFR: 98.2 mL/min (ref >60.00)
Glucose, Bld: 171 mg/dL — ABNORMAL HIGH (ref 70–99)
Potassium: 4.4 mEq/L (ref 3.5–5.1)
Sodium: 140 mEq/L (ref 135–145)

## 2021-03-02 NOTE — Patient Instructions (Signed)
Nice to see you. We will get labs and an x-ray today. Please check your blood pressure on a daily basis for the next 2 weeks and send me a list of your readings.

## 2021-03-02 NOTE — Progress Notes (Signed)
Kathryn Rumps, MD Phone: [phone]  Kathryn Massey is a 55 y.o. female who presents today for f/u.  DIABETES Disease Monitoring: Blood Sugar ranges-generally <130 Polyuria/phagia/dipsia- no      Optho- due Medications: Compliance- taking metformin, jardiance Hypoglycemic symptoms- no  HYPERTENSION  Disease Monitoring  Home BP Monitoring has not checked recently though was low 130s/70s Chest pain- no    Dyspnea- no Medications  Compliance-  Taking losartan, HCTZ, coreg.   Edema- no  Right elbow pain: This has been going on 2 months.  She notes it got stuck in a dryer while it was still moving.  It hurts near her medial and lateral epicondyles particularly if she straightens her arm out straight.  Tylenol helps ease it off.  Describes the pain as throbbing.  Grief: Patient notes her mother passed away recently.  She notes it was unexpected.  She has been dealing with it as well as she can.   Social History   Tobacco Use  Smoking Status Never Smoker  Smokeless Tobacco Never Used    Current Outpatient Medications on File Prior to Visit  Medication Sig Dispense Refill  . atorvastatin (LIPITOR) 40 MG tablet TAKE 1 TABLET BY MOUTH EVERY DAY 90 tablet 3  . Blood Glucose Monitoring Suppl (ONE TOUCH ULTRA SYSTEM KIT) W/DEVICE KIT 1 kit by Does not apply route once. 1 each 0  . carvedilol (COREG) 25 MG tablet TAKE 1 TABLET (25 MG TOTAL) BY MOUTH 2 (TWO) TIMES DAILY WITH A MEAL. 180 tablet 1  . glucose blood (ONE TOUCH ULTRA TEST) test strip Use as instructed 100 each 12  . hydrochlorothiazide (HYDRODIURIL) 25 MG tablet TAKE 1 TABLET BY MOUTH EVERY DAY 90 tablet 1  . JARDIANCE 25 MG TABS tablet TAKE 1 TABLET BY MOUTH EVERY DAY 90 tablet 1  . losartan (COZAAR) 100 MG tablet TAKE 1 TABLET BY MOUTH EVERY DAY 90 tablet 1  . metFORMIN (GLUCOPHAGE-XR) 500 MG 24 hr tablet TAKE 2 TABLETS (1,000 MG TOTAL) BY MOUTH 2 (TWO) TIMES DAILY WITH A MEAL. 360 tablet 2   No current  facility-administered medications on file prior to visit.     ROS see history of present illness  Objective  Physical Exam Vitals:   03/02/21 1032  BP: 120/70  Pulse: 73  Temp: 97.6 F (36.4 C)  SpO2: 98%    BP Readings from Last 3 Encounters:  03/02/21 120/70  09/29/20 118/78  09/13/20 140/70   Wt Readings from Last 3 Encounters:  03/02/21 162 lb 12.8 oz (73.8 kg)  09/29/20 166 lb 12.8 oz (75.7 kg)  09/13/20 171 lb (77.6 kg)    Physical Exam Constitutional:      General: She is not in acute distress.    Appearance: She is not diaphoretic.  Cardiovascular:     Rate and Rhythm: Normal rate and regular rhythm.     Heart sounds: Normal heart sounds.  Pulmonary:     Effort: Pulmonary effort is normal.     Breath sounds: Normal breath sounds.  Musculoskeletal:     Comments: Right elbow with mild tenderness near the medial and lateral epicondyles, there is soft tissue tenderness in these areas as well, no swelling, no discomfort on resisted pronation or supination, good range of motion with flexion and extension of her right elbow  Skin:    General: Skin is warm and dry.  Neurological:     Mental Status: She is alert.      Assessment/Plan: Please see individual  problem list.  Problem List Items Addressed This Visit    Diabetes mellitus type 2 in obese (HCC) (Chronic)    Check A1c.  Continue Metformin 1000 mg twice daily and Jardiance 25 mg daily.      Relevant Orders   HgB A1c   Grief    Related to her mother passing away.  I offered support.  She will let us know if she needs anything moving forward.      Hypertension (Chronic)    Well-controlled today.  Discussed goal of less than 130/80.  She will start checking at home and send [place] a list of her readings in 2 weeks.  She will continue losartan 100 mg once daily, HCTZ 25 mg daily, and carvedilol 25 mg twice daily.  Check BMP.      Relevant Orders   Basic Metabolic Panel (BMET)   Right elbow pain     This has been an ongoing issue for the last 2 months after having an injury to the right elbow.  This potentially could represent medial and lateral epicondylitis.  Discussed icing.  Advised she could use ibuprofen 600 mg every 8 hours next 2 days on a schedule and then as needed every 8 hours.  She will take this with food.  We will get an x-ray today given the persistent pain.      Relevant Orders   DG Elbow Complete Right      This visit occurred during the SARS-CoV-2 public health emergency.  Safety protocols were in place, including screening questions prior to the visit, additional usage of staff PPE, and extensive cleaning of exam room while observing appropriate contact time as indicated for disinfecting solutions.    Kathryn Rumps, MD [place]

## 2021-03-02 NOTE — Assessment & Plan Note (Signed)
Related to her mother passing away.  I offered support.  She will let us know if she needs anything moving forward.

## 2021-03-02 NOTE — Assessment & Plan Note (Signed)
Check A1c.  Continue Metformin 1000 mg twice daily and Jardiance 25 mg daily.

## 2021-03-02 NOTE — Assessment & Plan Note (Signed)
Well-controlled today.  Discussed goal of less than 130/80.  She will start checking at home and send [place] a list of her readings in 2 weeks.  She will continue losartan 100 mg once daily, HCTZ 25 mg daily, and carvedilol 25 mg twice daily.  Check BMP.

## 2021-03-02 NOTE — Assessment & Plan Note (Signed)
This has been an ongoing issue for the last 2 months after having an injury to the right elbow.  This potentially could represent medial and lateral epicondylitis.  Discussed icing.  Advised Kathryn Massey could use ibuprofen 600 mg every 8 hours next 2 days on a schedule and then as needed every 8 hours.  Kathryn Massey will take this with food.  We will get an x-ray today given the persistent pain.

## 2021-06-03 ENCOUNTER — Ambulatory Visit: Payer: BC Managed Care – PPO | Admitting: Family Medicine

## 2021-06-03 ENCOUNTER — Other Ambulatory Visit: Payer: Self-pay

## 2021-06-03 ENCOUNTER — Encounter: Payer: Self-pay | Admitting: Family Medicine

## 2021-06-03 DIAGNOSIS — E669 Obesity, unspecified: Secondary | ICD-10-CM

## 2021-06-03 DIAGNOSIS — I10 Essential (primary) hypertension: Secondary | ICD-10-CM

## 2021-06-03 DIAGNOSIS — E1169 Type 2 diabetes mellitus with other specified complication: Secondary | ICD-10-CM

## 2021-06-03 LAB — POCT GLYCOSYLATED HEMOGLOBIN (HGB A1C)
HbA1c POC (<> result, manual entry): 6.8 % (ref 4.0–5.6)
Hemoglobin A1C: 6.8 % — AB (ref 4.0–5.6)

## 2021-06-03 NOTE — Assessment & Plan Note (Addendum)
A1c is well controlled.  She will continue metformin 1000 mg twice daily and Jardiance 25 mg daily.

## 2021-06-03 NOTE — Progress Notes (Signed)
Tommi Rumps, MD Phone: [phone]  Kathryn Massey is a 55 y.o. female who presents today for f/u.  HYPERTENSION Disease Monitoring Home BP Monitoring not checking, though notes it was 116/74 at the eye doctor Chest pain- no    Dyspnea- no Medications Compliance-  taking HCTZ, losartan, coreg.   Edema- no  DIABETES Disease Monitoring: Blood Sugar ranges-not checking Polyuria/phagia/dipsia- no      Optho- UTD Medications: Compliance- taking metformin, jardiance Hypoglycemic symptoms- no    Social History   Tobacco Use  Smoking Status Never  Smokeless Tobacco Never    Current Outpatient Medications on File Prior to Visit  Medication Sig Dispense Refill   atorvastatin (LIPITOR) 40 MG tablet TAKE 1 TABLET BY MOUTH EVERY DAY 90 tablet 3   Blood Glucose Monitoring Suppl (ONE TOUCH ULTRA SYSTEM KIT) W/DEVICE KIT 1 kit by Does not apply route once. 1 each 0   carvedilol (COREG) 25 MG tablet TAKE 1 TABLET (25 MG TOTAL) BY MOUTH 2 (TWO) TIMES DAILY WITH A MEAL. 180 tablet 1   glucose blood (ONE TOUCH ULTRA TEST) test strip Use as instructed 100 each 12   hydrochlorothiazide (HYDRODIURIL) 25 MG tablet TAKE 1 TABLET BY MOUTH EVERY DAY 90 tablet 1   JARDIANCE 25 MG TABS tablet TAKE 1 TABLET BY MOUTH EVERY DAY 90 tablet 1   losartan (COZAAR) 100 MG tablet TAKE 1 TABLET BY MOUTH EVERY DAY 90 tablet 1   metFORMIN (GLUCOPHAGE-XR) 500 MG 24 hr tablet TAKE 2 TABLETS (1,000 MG TOTAL) BY MOUTH 2 (TWO) TIMES DAILY WITH A MEAL. 360 tablet 2   No current facility-administered medications on file prior to visit.     ROS see history of present illness  Objective  Physical Exam Vitals:   06/03/21 1027 06/03/21 1046  BP: (!) 146/84 124/76  Pulse: 64   Resp: 18   Temp: 98.1 F (36.7 C)   SpO2: 98%     BP Readings from Last 3 Encounters:  06/03/21 124/76  03/02/21 120/70  09/29/20 118/78   Wt Readings from Last 3 Encounters:  06/03/21 163 lb 2 oz (74 kg)  03/02/21 162 lb  12.8 oz (73.8 kg)  09/29/20 166 lb 12.8 oz (75.7 kg)    Physical Exam Constitutional:      General: She is not in acute distress.    Appearance: She is not diaphoretic.  Cardiovascular:     Rate and Rhythm: Normal rate and regular rhythm.     Heart sounds: Normal heart sounds.  Pulmonary:     Effort: Pulmonary effort is normal.     Breath sounds: Normal breath sounds.  Musculoskeletal:     Right lower leg: No edema.     Left lower leg: No edema.  Skin:    General: Skin is warm and dry.  Neurological:     Mental Status: She is alert.     Assessment/Plan: Please see individual problem list.  Problem List Items Addressed This Visit     Diabetes mellitus type 2 in obese (HCC) (Chronic)    A1c is well controlled.  She will continue metformin 1000 mg twice daily and Jardiance 25 mg daily.       Relevant Orders   POCT HgB A1C (Completed)   Hypertension (Chronic)    Adequate control.  I encouraged her to start checking this at home.  She will continue HCTZ 25 mg once daily, losartan 100 mg daily, and carvedilol 25 mg twice daily.  Health Maintenance: The patient is going to check with her insurance regarding Shingrix vaccine.  I encouraged her to get her second COVID booster.  Return in about 3 months (around 09/03/2021) for Diabetes.  This visit occurred during the SARS-CoV-2 public health emergency.  Safety protocols were in place, including screening questions prior to the visit, additional usage of staff PPE, and extensive cleaning of exam room while observing appropriate contact time as indicated for disinfecting solutions.    Tommi Rumps, MD [place]

## 2021-06-03 NOTE — Assessment & Plan Note (Signed)
Adequate control.  I encouraged her to start checking this at home.  She will continue HCTZ 25 mg once daily, losartan 100 mg daily, and carvedilol 25 mg twice daily.

## 2021-06-03 NOTE — Patient Instructions (Signed)
Nice to see you. Please check with your insurance regarding the Shingrix vaccine. Please consider getting the second COVID booster. We will continue your metformin, Jardiance, HCTZ, losartan, and carvedilol.

## 2021-06-21 ENCOUNTER — Other Ambulatory Visit: Payer: Self-pay | Admitting: Family Medicine

## 2021-07-04 HISTORY — PX: BREAST CYST EXCISION: SHX579

## 2021-07-07 ENCOUNTER — Other Ambulatory Visit: Payer: Self-pay | Admitting: Family Medicine

## 2021-07-20 ENCOUNTER — Other Ambulatory Visit: Payer: Self-pay | Admitting: Family Medicine

## 2021-09-05 ENCOUNTER — Encounter: Payer: Self-pay | Admitting: Family Medicine

## 2021-09-05 ENCOUNTER — Ambulatory Visit: Payer: BC Managed Care – PPO | Admitting: Family Medicine

## 2021-09-05 ENCOUNTER — Other Ambulatory Visit: Payer: Self-pay

## 2021-09-05 VITALS — BP 130/70 | HR 73 | Temp 98.3°F | Ht 63.0 in | Wt 166.8 lb

## 2021-09-05 DIAGNOSIS — I10 Essential (primary) hypertension: Secondary | ICD-10-CM | POA: Diagnosis not present

## 2021-09-05 DIAGNOSIS — E1169 Type 2 diabetes mellitus with other specified complication: Secondary | ICD-10-CM

## 2021-09-05 DIAGNOSIS — Z23 Encounter for immunization: Secondary | ICD-10-CM

## 2021-09-05 DIAGNOSIS — E785 Hyperlipidemia, unspecified: Secondary | ICD-10-CM | POA: Diagnosis not present

## 2021-09-05 DIAGNOSIS — E669 Obesity, unspecified: Secondary | ICD-10-CM

## 2021-09-05 LAB — LIPID PANEL
Cholesterol: 122 mg/dL (ref 0–200)
HDL: 46.1 mg/dL (ref >39.00)
NonHDL: 75.93
Total CHOL/HDL Ratio: 3
Triglycerides: 249 mg/dL — ABNORMAL HIGH (ref 0.0–149.0)
VLDL: 49.8 mg/dL — ABNORMAL HIGH (ref 0.0–40.0)

## 2021-09-05 LAB — COMPREHENSIVE METABOLIC PANEL
ALT: 28 U/L (ref 0–35)
AST: 22 U/L (ref 0–37)
Albumin: 4.8 g/dL (ref 3.5–5.2)
Alkaline Phosphatase: 61 U/L (ref 39–117)
BUN: 17 mg/dL (ref 6–23)
CO2: 29 mEq/L (ref 19–32)
Calcium: 10.3 mg/dL (ref 8.4–10.5)
Chloride: 98 mEq/L (ref 96–112)
Creatinine, Ser: 0.64 mg/dL (ref 0.40–1.20)
GFR: 98.58 mL/min (ref >60.00)
Glucose, Bld: 85 mg/dL (ref 70–99)
Potassium: 4.2 mEq/L (ref 3.5–5.1)
Sodium: 139 mEq/L (ref 135–145)
Total Bilirubin: 0.5 mg/dL (ref 0.2–1.2)
Total Protein: 7 g/dL (ref 6.0–8.3)

## 2021-09-05 LAB — LDL CHOLESTEROL, DIRECT: Direct LDL: 52 mg/dL

## 2021-09-05 LAB — HEMOGLOBIN A1C: Hgb A1c MFr Bld: 7.1 % — ABNORMAL HIGH (ref 4.6–6.5)

## 2021-09-05 NOTE — Patient Instructions (Signed)
Nice to see you. We will get lab work today. Please periodically check your blood sugar and your blood pressure.  If your blood pressure trends up over 130/80 please let us know.

## 2021-09-05 NOTE — Assessment & Plan Note (Addendum)
Check A1c.  Continue Jardiance 25 mg once daily and metformin 1000 mg twice daily.  Diabetic eye exam records requested.

## 2021-09-05 NOTE — Assessment & Plan Note (Signed)
Continue Lipitor 40 mg once daily.  Check lipid panel. 

## 2021-09-05 NOTE — Progress Notes (Signed)
Tommi Rumps, MD Phone: [phone]  Kathryn Massey is a 55 y.o. female who presents today for f/u.  HYPERTENSION Disease Monitoring: Blood pressure range-generally <130/80 though not checking too frequently Chest pain- no      Dyspnea- no Medications: Compliance- taking HCTZ, coreg, losartan   Edema- no  DIABETES Disease Monitoring: Blood Sugar ranges-fasting mostly less than 130, though not checking frequently Polyuria/phagia/dipsia- no      Optho- saw earlier this year Medications: Compliance- taking jardiance, metformin Hypoglycemic symptoms- no  HYPERLIPIDEMIA Disease Monitoring: See symptoms for Hypertension Medications: Compliance- taking lipitor Right upper quadrant pain- no  Muscle aches- no    Social History   Tobacco Use  Smoking Status Never  Smokeless Tobacco Never    Current Outpatient Medications on File Prior to Visit  Medication Sig Dispense Refill   atorvastatin (LIPITOR) 40 MG tablet TAKE 1 TABLET BY MOUTH EVERY DAY 90 tablet 3   Blood Glucose Monitoring Suppl (ONE TOUCH ULTRA SYSTEM KIT) W/DEVICE KIT 1 kit by Does not apply route once. 1 each 0   carvedilol (COREG) 25 MG tablet TAKE 1 TABLET (25 MG TOTAL) BY MOUTH 2 (TWO) TIMES DAILY WITH A MEAL. 180 tablet 1   glucose blood (ONE TOUCH ULTRA TEST) test strip Use as instructed 100 each 12   hydrochlorothiazide (HYDRODIURIL) 25 MG tablet TAKE 1 TABLET BY MOUTH EVERY DAY 90 tablet 1   JARDIANCE 25 MG TABS tablet TAKE 1 TABLET BY MOUTH EVERY DAY 90 tablet 1   losartan (COZAAR) 100 MG tablet TAKE 1 TABLET BY MOUTH EVERY DAY 90 tablet 1   metFORMIN (GLUCOPHAGE-XR) 500 MG 24 hr tablet TAKE 2 TABLETS (1,000 MG TOTAL) BY MOUTH 2 (TWO) TIMES DAILY WITH A MEAL. 360 tablet 2   No current facility-administered medications on file prior to visit.     ROS see history of present illness  Objective  Physical Exam Vitals:   09/05/21 1321  BP: 130/70  Pulse: 73  Temp: 98.3 F (36.8 C)  SpO2: 97%     BP Readings from Last 3 Encounters:  09/05/21 130/70  06/03/21 124/76  03/02/21 120/70   Wt Readings from Last 3 Encounters:  09/05/21 166 lb 12.8 oz (75.7 kg)  06/03/21 163 lb 2 oz (74 kg)  03/02/21 162 lb 12.8 oz (73.8 kg)    Physical Exam Constitutional:      General: She is not in acute distress.    Appearance: She is not diaphoretic.  Cardiovascular:     Rate and Rhythm: Normal rate and regular rhythm.     Heart sounds: Normal heart sounds.  Pulmonary:     Effort: Pulmonary effort is normal.     Breath sounds: Normal breath sounds.  Musculoskeletal:     Right lower leg: No edema.     Left lower leg: No edema.  Skin:    General: Skin is warm and dry.  Neurological:     Mental Status: She is alert.     Assessment/Plan: Please see individual problem list.  Problem List Items Addressed This Visit     Diabetes mellitus type 2 in obese (HCC) (Chronic)    Check A1c.  Continue Jardiance 25 mg once daily and metformin 1000 mg twice daily.  Diabetic eye exam records requested.      Relevant Orders   Comp Met (CMET)   HgB A1c   Hypertension (Chronic)    Adequate control.  She will continue HCTZ 25 mg once daily, losartan 100 mg once  daily, and carvedilol.  Check labs.      Relevant Orders   Comp Met (CMET)   Hyperlipidemia    Continue Lipitor 40 mg once daily.  Check lipid panel.      Relevant Orders   Comp Met (CMET)   Lipid panel   Other Visit Diagnoses     Need for immunization against influenza    -  Primary   Relevant Orders   Flu Vaccine QUAD 36moIM (Fluarix, Fluzone & Alfiuria Quad PF) (Completed)        Health Maintenance: The patient was encouraged to get the newest COVID booster.  She was encouraged to get her Shingrix vaccine as well.  Return in about 6 months (around 03/06/2022) for Diabetes/hypertension.  This visit occurred during the SARS-CoV-2 public health emergency.  Safety protocols were in place, including screening questions  prior to the visit, additional usage of staff PPE, and extensive cleaning of exam room while observing appropriate contact time as indicated for disinfecting solutions.    ETommi Rumps MD [facility]

## 2021-09-05 NOTE — Assessment & Plan Note (Signed)
Adequate control.  She will continue HCTZ 25 mg once daily, losartan 100 mg once daily, and carvedilol.  Check labs.

## 2021-09-15 NOTE — Progress Notes (Signed)
After 3 attempts to reach the patient by phone this letter with comments was sent to the patient via mychart. Elcie Pelster,cma

## 2021-10-06 ENCOUNTER — Other Ambulatory Visit: Payer: Self-pay | Admitting: Family Medicine

## 2021-10-13 ENCOUNTER — Other Ambulatory Visit: Payer: Self-pay

## 2021-10-13 ENCOUNTER — Encounter: Payer: Self-pay | Admitting: Obstetrics

## 2021-10-13 ENCOUNTER — Ambulatory Visit (INDEPENDENT_AMBULATORY_CARE_PROVIDER_SITE_OTHER): Payer: BC Managed Care – PPO | Admitting: Obstetrics

## 2021-10-13 VITALS — BP 120/70 | Ht 63.0 in | Wt 165.0 lb

## 2021-10-13 DIAGNOSIS — Z124 Encounter for screening for malignant neoplasm of cervix: Secondary | ICD-10-CM

## 2021-10-13 DIAGNOSIS — Z01419 Encounter for gynecological examination (general) (routine) without abnormal findings: Secondary | ICD-10-CM

## 2021-10-13 DIAGNOSIS — Z1231 Encounter for screening mammogram for malignant neoplasm of breast: Secondary | ICD-10-CM | POA: Diagnosis not present

## 2021-10-13 NOTE — Progress Notes (Signed)
Gynecology Annual Exam  PCP: Leone Haven, MD  Chief Complaint:  Chief Complaint  Patient presents with   Gynecologic Exam    No concerns    History of Present Illness:Patient is a 55 y.o. G3P3003 presents for annual exam. The patient has no complaints today. Murriel is postmenopausal.She is partnered and sexually active. She denies any gyn issues today. Her pap smears have been normal historically.   The patient is sexually active. She denies dyspareunia.  The patient does perform self breast exams.  There is no notable family history of breast or ovarian cancer in her family.  The patient wears seatbelts: yes.   The patient has regular exercise:  She walks all day at work, and then does additional walking at home .    The patient denies current symptoms of depression.     Review of Systems: Review of Systems  Constitutional: Negative.   HENT: Negative.    Eyes: Negative.   Respiratory: Negative.    Cardiovascular: Negative.   Gastrointestinal: Negative.   Genitourinary: Negative.   Musculoskeletal: Negative.   Skin: Negative.   Neurological: Negative.   Endo/Heme/Allergies: Negative.   Psychiatric/Behavioral: Negative.         GAD score 0 PHQ 0   Past Medical History:  Patient Active Problem List   Diagnosis Date Noted   Right elbow pain 03/02/2021   Grief 03/02/2021   Right knee pain 09/01/2019   Diarrhea 11/06/2017   Hyperlipidemia 11/20/2016   New onset of headaches after age 65 06/23/2016   Osteopenia 07/15/2015   Chronic meniscal tear of knee 07/13/2014    Posterior medial    Diabetes mellitus type 2 in obese ([place]) 12/13/2012   Hypertension 04/17/2012   Obesity (BMI 30-39.9) 04/17/2012    Past Surgical History:  Past Surgical History:  Procedure Laterality Date   COLONOSCOPY  2010   benign polyps   COLONOSCOPY WITH PROPOFOL N/A 09/27/2015   Procedure: COLONOSCOPY WITH PROPOFOL;  Surgeon: Lollie Sails, MD;  Location: [facility] ENDOSCOPY;   Service: Endoscopy;  Laterality: N/A;   FOOT SURGERY Left 2004   Dr. Vickki Muff tendon release for plantar fasciitis   PUBOVAGINAL SLING  2005   Dr. Amalia Hailey mesh sling for SVI    Gynecologic History:  No LMP recorded (lmp unknown). Patient is postmenopausal. Last Pap: 2021 Results were: NILM no abnormalities  Last mammogram: 2021 Results were: She hadBI-RAD II, and subsequently had a smalll cyst removed from the right breast in November of 2021. It was benign, and she is to have her annual screening mammogram this Fall.  Obstetric History: V6P0141  Family History:  Family History  Problem Relation Age of Onset   Hypertension Mother    Diabetes Mother    COPD Father    Hypertension Father    Cirrhosis Father 72       deceased   Hyperlipidemia Sister    Heart disease Brother 49       had bypass surgery   Diabetes Brother    Hypertension Brother    Lung cancer Maternal Uncle    Lung cancer Maternal Grandmother    Breast cancer Neg Hx     Social History:  Social History   Socioeconomic History   Marital status: Married    Spouse name: Richard Personnel officer"   Number of children: 3   Years of education: 12   Highest education level: Not on file  Occupational History   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     Work - Conservation officer, historic buildings, nutritionist   Diet - Healthy   Exercise - working/ cleaning at work since [place] Strain: Not on [employer] Insecurity: Not on file  Transportation Needs: Not on file  Physical Activity: Not on file  Stress: Not  on file  Social Connections: Not on file  Intimate Partner Violence: Not on file    Allergies:  No Known Allergies  Medications: Prior to Admission medications   Medication Sig Start Date End Date Taking? Authorizing Provider  atorvastatin (LIPITOR) 40 MG tablet TAKE 1 TABLET BY MOUTH EVERY DAY 10/06/21  Yes Leone Haven, MD  Blood Glucose Monitoring Suppl (ONE TOUCH ULTRA SYSTEM KIT) W/DEVICE KIT 1 kit by Does not apply route once. 11/21/12  Yes Jackolyn Confer, MD  carvedilol (COREG) 25 MG tablet TAKE 1 TABLET (25 MG TOTAL) BY MOUTH 2 (TWO) TIMES DAILY WITH A MEAL. 07/20/21  Yes Leone Haven, MD  glucose blood (ONE TOUCH ULTRA TEST) test strip Use as instructed 01/05/20  Yes Leone Haven, MD  hydrochlorothiazide (HYDRODIURIL) 25 MG tablet TAKE 1 TABLET BY MOUTH EVERY DAY 06/21/21  Yes Leone Haven, MD  JARDIANCE 25 MG TABS tablet TAKE 1 TABLET BY MOUTH EVERY DAY 06/21/21  Yes Leone Haven, MD  losartan (COZAAR) 100 MG tablet TAKE 1 TABLET BY MOUTH EVERY DAY 06/21/21  Yes Leone Haven, MD  metFORMIN (GLUCOPHAGE-XR) 500 MG 24 hr tablet TAKE 2 TABLETS (1,000 MG TOTAL) BY MOUTH 2 (TWO) TIMES DAILY WITH A MEAL. 07/11/21  Yes Leone Haven, MD    Physical Exam Vitals: Blood pressure 120/70, height '5\' 3"'  (1.6 m), weight 165 lb (74.8 kg).  General: NAD HEENT: normocephalic, anicteric Thyroid: no enlargement, no palpable nodules Pulmonary: No increased work of breathing, CTAB Cardiovascular: RRR, distal pulses 2+ Breast: Breast symmetrical, no tenderness, no palpable nodules or masses, no skin or nipple retraction present, no nipple discharge.  No axillary or supraclavicular lymphadenopathy. Abdomen: NABS, soft, non-tender, non-distended.  Umbilicus without lesions.  No hepatomegaly, splenomegaly or masses palpable. No evidence of hernia  Genitourinary:  External: Normal external female genitalia.  Normal urethral meatus, normal Bartholin's and Skene's  glands.    Vagina: Normal vaginal mucosa, no evidence of prolapse.    Cervix: Grossly normal in appearance, no bleeding  Uterus: Non-enlarged, mobile, normal contour.  No CMT  Adnexa: ovaries non-enlarged, no adnexal masses  Rectal: deferred  Lymphatic: no evidence of inguinal lymphadenopathy Extremities: no edema, erythema, or tenderness Neurologic: Grossly intact Psychiatric: mood appropriate, affect full  Female chaperone present for pelvic and breast  portions of the physical exam     Assessment: 55 y.o. G3P3003 routine annual exam  Plan: Problem List Items Addressed This Visit   None Visit Diagnoses     Breast cancer screening by mammogram    -  Primary   Relevant Orders   MM DIAG BREAST TOMO BILATERAL   Women's annual  routine gynecological examination       Cervical cancer screening           1) Mammogram - recommend yearly screening mammogram.  Mammogram Was ordered today  2) STI screening  was notoffered and therefore not obtained  3) ASCCP guidelines and rational discussed.  Patient opts for every 5 years screening interval  4) Osteoporosis  - per USPTF routine screening DEXA at age 69 -  Consider FDA-approved medical therapies in postmenopausal women and men aged 38 years and older, based on the following: a) A hip or vertebral (clinical or morphometric) fracture b) T-score ? -2.5 at the femoral neck or spine after appropriate evaluation to exclude secondary causes C) Low bone mass (T-score between -1.0 and -2.5 at the femoral neck or spine) and a 10-year probability of a hip fracture ? 3% or a 10-year probability of a major osteoporosis-related fracture ? 20% based on the [place]-adapted WHO algorithm   5) Routine healthcare maintenance including cholesterol, diabetes screening discussed managed by PCP  6) Colonoscopy handled by PCP.  Screening recommended starting at age 38 for average risk individuals, age 29 for individuals deemed at increased risk (including  African Americans) and recommended to continue until age 47.  For patient age 68-85 individualized approach is recommended.  Gold standard screening is via colonoscopy, Cologuard screening is an acceptable alternative for patient unwilling or unable to undergo colonoscopy.  "Colorectal cancer screening for average?risk adults: 2018 guideline update from the [place]: A Cancer Journal for Clinicians: May 02, 2017   7) Return in about 1 year (around 10/13/2022) for annual.    Imagene Riches, CNM  10/13/2021 3:55 PM   Mosetta Pigeon, [facility] 10/13/2021, 3:55 PM

## 2021-10-18 ENCOUNTER — Other Ambulatory Visit: Payer: Self-pay | Admitting: Obstetrics

## 2021-10-18 DIAGNOSIS — Z1231 Encounter for screening mammogram for malignant neoplasm of breast: Secondary | ICD-10-CM

## 2021-11-08 ENCOUNTER — Other Ambulatory Visit: Payer: Self-pay

## 2021-11-08 ENCOUNTER — Ambulatory Visit
Admission: RE | Admit: 2021-11-08 | Discharge: 2021-11-08 | Disposition: A | Discharge status: Home / Self Care | Payer: BC Managed Care – PPO | Source: Ambulatory Visit | Attending: Obstetrics | Admitting: Obstetrics

## 2021-11-08 DIAGNOSIS — Z1231 Encounter for screening mammogram for malignant neoplasm of breast: Secondary | ICD-10-CM | POA: Insufficient documentation

## 2021-12-14 ENCOUNTER — Other Ambulatory Visit: Payer: Self-pay | Admitting: Family Medicine

## 2021-12-16 ENCOUNTER — Other Ambulatory Visit: Payer: Self-pay | Admitting: Family Medicine

## 2022-01-14 ENCOUNTER — Other Ambulatory Visit: Payer: Self-pay | Admitting: Family Medicine

## 2022-03-06 ENCOUNTER — Ambulatory Visit: Payer: BC Managed Care – PPO | Admitting: Family Medicine

## 2022-03-20 ENCOUNTER — Ambulatory Visit: Payer: BC Managed Care – PPO | Admitting: Family Medicine

## 2022-03-27 ENCOUNTER — Ambulatory Visit: Payer: BC Managed Care – PPO | Admitting: Family Medicine

## 2022-03-27 ENCOUNTER — Encounter: Payer: Self-pay | Admitting: Family Medicine

## 2022-03-27 DIAGNOSIS — E1169 Type 2 diabetes mellitus with other specified complication: Secondary | ICD-10-CM | POA: Diagnosis not present

## 2022-03-27 DIAGNOSIS — E669 Obesity, unspecified: Secondary | ICD-10-CM | POA: Diagnosis not present

## 2022-03-27 DIAGNOSIS — E785 Hyperlipidemia, unspecified: Secondary | ICD-10-CM

## 2022-03-27 DIAGNOSIS — E663 Overweight: Secondary | ICD-10-CM | POA: Diagnosis not present

## 2022-03-27 DIAGNOSIS — I10 Essential (primary) hypertension: Secondary | ICD-10-CM | POA: Diagnosis not present

## 2022-03-27 NOTE — Assessment & Plan Note (Signed)
Patient will continue healthy activity level and dietary changes. ?

## 2022-03-27 NOTE — Assessment & Plan Note (Signed)
Seems to be well controlled.  Check A1c.  Continue Jardiance 25 mg daily and metformin XR 1000 mg twice daily. ?

## 2022-03-27 NOTE — Assessment & Plan Note (Signed)
Continue Lipitor 40 mg once daily.

## 2022-03-27 NOTE — Progress Notes (Signed)
?Tommi Rumps, MD ?Phone: [phone] ? ?Kathryn Massey is a 56 y.o. female who presents today for f/u ? ?DIABETES ?Disease Monitoring: ?Blood Sugar ranges-120s fasting, 117 2 hours after dinner Polyuria/phagia/dipsia- no      Optho- due ?Medications: ?Compliance- taking jardiance, metformin Hypoglycemic symptoms- no ? ?HYPERTENSION ?Disease Monitoring ?Home BP Monitoring around 130/70s  Chest pain- no    Dyspnea- no ?Medications ?Compliance-  taking coreg, losartan, hydrochlorothiazide.   Edema- no ?BMET ?   ?Component Value Date/Time  ? NA 139 09/05/2021 1331  ? K 4.2 09/05/2021 1331  ? CL 98 09/05/2021 1331  ? CO2 29 09/05/2021 1331  ? GLUCOSE 85 09/05/2021 1331  ? BUN 17 09/05/2021 1331  ? CREATININE 0.64 09/05/2021 1331  ? CALCIUM 10.3 09/05/2021 1331  ? GFRNONAA >60 05/26/2015 1147  ? GFRAA >60 05/26/2015 1147  ? ?HYPERLIPIDEMIA ?Symptoms ?Chest pain on exertion:  no   ?Medications: ?Compliance- taking lipitor Right upper quadrant pain- no  Muscle aches- no ?Lipid Panel  ?   ?Component Value Date/Time  ? CHOL 122 09/05/2021 1331  ? TRIG 249.0 (H) 09/05/2021 1331  ? HDL 46.10 09/05/2021 1331  ? CHOLHDL 3 09/05/2021 1331  ? VLDL 49.8 (H) 09/05/2021 1331  ? [place] 35 06/02/2019 0853  ? LDLDIRECT 52.0 09/05/2021 1331  ? ?Overweight: Patient reports Kathryn Massey is cut down on fried foods.  Kathryn Massey is not eating as many sweets.  Kathryn Massey generally gets fruits and vegetables.  Kathryn Massey is getting 10,000 steps a day.  Kathryn Massey notes occasionally when Kathryn Massey walks very long distances her hips will be sore though Kathryn Massey will take a Tylenol and that will resolve. ? ? ?Social History  ? ?Tobacco Use  ?Smoking Status Never  ?Smokeless Tobacco Never  ? ? ?Current Outpatient Medications on File Prior to Visit  ?Medication Sig Dispense Refill  ? atorvastatin (LIPITOR) 40 MG tablet TAKE 1 TABLET BY MOUTH EVERY DAY 90 tablet 3  ? Blood Glucose Monitoring Suppl (ONE TOUCH ULTRA SYSTEM KIT) W/DEVICE KIT 1 kit by Does not apply route once. 1 each 0  ?  carvedilol (COREG) 25 MG tablet TAKE 1 TABLET (25 MG TOTAL) BY MOUTH 2 (TWO) TIMES DAILY WITH A MEAL. 180 tablet 1  ? glucose blood (ONE TOUCH ULTRA TEST) test strip Use as instructed 100 each 12  ? hydrochlorothiazide (HYDRODIURIL) 25 MG tablet TAKE 1 TABLET BY MOUTH EVERY DAY 90 tablet 1  ? JARDIANCE 25 MG TABS tablet TAKE 1 TABLET BY MOUTH EVERY DAY 90 tablet 1  ? losartan (COZAAR) 100 MG tablet TAKE 1 TABLET BY MOUTH EVERY DAY 90 tablet 1  ? metFORMIN (GLUCOPHAGE-XR) 500 MG 24 hr tablet TAKE 2 TABLETS (1,000 MG TOTAL) BY MOUTH 2 (TWO) TIMES DAILY WITH A MEAL. 360 tablet 2  ? ?No current facility-administered medications on file prior to visit.  ? ? ? ?ROS see history of present illness ? ?Objective ? ?Physical Exam ?Vitals:  ? 03/27/22 1442  ?BP: 134/76  ?Pulse: 67  ?Resp: 14  ?Temp: 98 ?F (36.7 ?C)  ?SpO2: 98%  ? ? ?BP Readings from Last 3 Encounters:  ?03/27/22 134/76  ?10/13/21 120/70  ?09/05/21 130/70  ? ?Wt Readings from Last 3 Encounters:  ?03/27/22 161 lb 12.8 oz (73.4 kg)  ?10/13/21 165 lb (74.8 kg)  ?09/05/21 166 lb 12.8 oz (75.7 kg)  ? ? ?Physical Exam ?Constitutional:   ?   General: Kathryn Massey is not in acute distress. ?   Appearance: Kathryn Massey is not diaphoretic.  ?  Cardiovascular:  ?   Rate and Rhythm: Normal rate and regular rhythm.  ?   Heart sounds: Normal heart sounds.  ?Pulmonary:  ?   Effort: Pulmonary effort is normal.  ?   Breath sounds: Normal breath sounds.  ?Skin: ?   General: Skin is warm and dry.  ?Neurological:  ?   Mental Status: Kathryn Massey is alert.  ? ?Diabetic Foot Exam - Simple   ?Simple Foot Form ?Diabetic Foot exam was performed with the following findings: Yes 03/27/2022  2:56 PM  ?Visual Inspection ?No deformities, no ulcerations, no other skin breakdown bilaterally: Yes ?Sensation Testing ?Intact to touch and monofilament testing bilaterally: Yes ?Pulse Check ?Posterior Tibialis and Dorsalis pulse intact bilaterally: Yes ?Comments ?  ? ? ? ?Assessment/Plan: Please see individual problem  list. ? ?Problem List Items Addressed This Visit   ? ? Diabetes mellitus type 2 in obese (HCC) (Chronic)  ?  Seems to be well controlled.  Check A1c.  Continue Jardiance 25 mg daily and metformin XR 1000 mg twice daily. ? ?  ?  ? Relevant Orders  ? HgB A1c  ? Basic Metabolic Panel (BMET)  ? Hypertension (Chronic)  ?  Stable.  Kathryn Massey will continue hydrochlorothiazide 25 mg once daily, losartan 100 mg daily, and carvedilol 25 mg twice daily. ? ?  ?  ? Relevant Orders  ? Basic Metabolic Panel (BMET)  ? Hyperlipidemia  ?  Continue Lipitor 40 mg once daily. ? ?  ?  ? Overweight (BMI 25.0-29.9)  ?  Patient will continue healthy activity level and dietary changes. ? ?  ?  ? ? ?Return in about 6 months (around 09/26/2022) for Diabetes/hypertension/hyperlipidemia. ? ?This visit occurred during the SARS-CoV-2 public health emergency.  Safety protocols were in place, including screening questions prior to the visit, additional usage of staff PPE, and extensive cleaning of exam room while observing appropriate contact time as indicated for disinfecting solutions.  ? ? ?Tommi Rumps, MD ?[place] ? ?

## 2022-03-27 NOTE — Assessment & Plan Note (Signed)
Stable.  She will continue hydrochlorothiazide 25 mg once daily, losartan 100 mg daily, and carvedilol 25 mg twice daily. ?

## 2022-03-27 NOTE — Patient Instructions (Signed)
Nice to see you. ?We will check lab work and contact you with results. ?Please continue to work on healthy diet and exercise. ?

## 2022-03-28 LAB — BASIC METABOLIC PANEL
BUN: 17 mg/dL (ref 6–23)
CO2: 29 mEq/L (ref 19–32)
Calcium: 10.2 mg/dL (ref 8.4–10.5)
Chloride: 98 mEq/L (ref 96–112)
Creatinine, Ser: 0.66 mg/dL (ref 0.40–1.20)
GFR: 97.47 mL/min (ref >60.00)
Glucose, Bld: 97 mg/dL (ref 70–99)
Potassium: 4.4 mEq/L (ref 3.5–5.1)
Sodium: 138 mEq/L (ref 135–145)

## 2022-03-28 LAB — HEMOGLOBIN A1C: Hgb A1c MFr Bld: 7.1 % — ABNORMAL HIGH (ref 4.6–6.5)

## 2022-04-06 ENCOUNTER — Telehealth: Payer: Self-pay | Admitting: Family Medicine

## 2022-04-06 NOTE — Telephone Encounter (Signed)
Pt returning call

## 2022-04-06 NOTE — Telephone Encounter (Signed)
I called and spoke with the patient. See result note.  Leslyn Monda,cma  ?

## 2022-04-07 ENCOUNTER — Encounter: Payer: Self-pay | Admitting: Internal Medicine

## 2022-04-07 ENCOUNTER — Telehealth (INDEPENDENT_AMBULATORY_CARE_PROVIDER_SITE_OTHER): Payer: BC Managed Care – PPO | Admitting: Internal Medicine

## 2022-04-07 DIAGNOSIS — J0111 Acute recurrent frontal sinusitis: Secondary | ICD-10-CM

## 2022-04-07 MED ORDER — AMOXICILLIN-POT CLAVULANATE 875-125 MG PO TABS: 1.0000 | ORAL_TABLET | Freq: Two times a day (BID) | ORAL | 0 refills | Status: DC

## 2022-04-07 NOTE — Progress Notes (Signed)
Virtual Visit via Video Note ? ?I connected with Kathryn Massey ? on 04/07/22 at10 AM EDT by a video enabled telemedicine application and verified that I am speaking with the correct person using two identifiers. ? Location patient: [place] ?Location provider:work or home office ?Persons participating in the virtual visit: patient, provider ? ?I discussed the limitations and requested verbal permission for telemedicine visit. The patient expressed understanding and agreed to proceed. ? ? ?HPI: ? ?Acute telemedicine visit for : ? Sinus pain and pressure with ha and pnd and yellow green mucous sore puffy swollen eyes and hoarse voice lost voice yesterday not tested for covid onset weds last week this is recurrent  ?Monday had 6 month f/u with PCP last weds had sxs start after f/u  ?She had been outside mowing the lawn  ?She has pressure on her cheeks in head  ?Tried benadryl sudafed, zyrtec and netty pot  ?No fever  ?Will do home covid test after 1:30 and my chart back  ?-Pertinent past medical history: see below ?-Pertinent medication allergies:No Known Allergies ?-COVID-19 vaccine status:  ?Immunization History  ?Administered Date(s) Administered  ? Influenza Split 09/24/2018  ? Influenza,inj,Quad PF,6+ Mos 09/23/2013, 10/08/2014, 09/17/2017, 09/01/2019, 09/29/2020, 09/05/2021  ? Influenza-Unspecified 11/07/2012, 09/13/2015, 09/25/2016  ? Moderna Covid-19 Vaccine Bivalent Booster 65yr & up 09/21/2021  ? Moderna Sars-Covid-2 Vaccination 01/31/2020, 02/28/2020, 10/25/2020  ? Pneumococcal Polysaccharide-23 07/04/2013  ? Tdap 05/22/2007, 02/20/2018  ? ? ? ?ROS: See pertinent positives and negatives per HPI. ? ?Past Medical History:  ?Diagnosis Date  ? Diabetes mellitus without complication ([place]   ? borderline  ? DVT femoral (deep venous thrombosis) with thrombophlebitis, left ([place] 2004  ? had boot on left LE after foot surgery  ? Hypertension   ? Liver disorder 2010  ? mild liver enzyme elevation  ? Osteopenia 2016  ?  mild DEXA scores -1.4, -1.5  ? ? ?Past Surgical History:  ?Procedure Laterality Date  ? BREAST CYST EXCISION Right 07/04/2021  ? cyst removed  ? COLONOSCOPY  2010  ? benign polyps  ? COLONOSCOPY WITH PROPOFOL N/A 09/27/2015  ? Procedure: COLONOSCOPY WITH PROPOFOL;  Surgeon: MLollie Sails MD;  [facility];  Service: Endoscopy;  Laterality: N/A;  ? FOOT SURGERY Left 2004  ? Dr. FVickki Mufftendon release for plantar fasciitis  ? PUBOVAGINAL SLING  2005  ? Dr. EAmalia Haileymesh sling for SVI  ? ? ? ?Current Outpatient Medications:  ?  amoxicillin-clavulanate (AUGMENTIN) 875-125 MG tablet, Take 1 tablet by mouth 2 (two) times daily. X7-10 days with food, Disp: 20 tablet, Rfl: 0 ?  atorvastatin (LIPITOR) 40 MG tablet, TAKE 1 TABLET BY MOUTH EVERY DAY, Disp: 90 tablet, Rfl: 3 ?  Blood Glucose Monitoring Suppl (ONE TOUCH ULTRA SYSTEM KIT) W/DEVICE KIT, 1 kit by Does not apply route once., Disp: 1 each, Rfl: 0 ?  carvedilol (COREG) 25 MG tablet, TAKE 1 TABLET (25 MG TOTAL) BY MOUTH 2 (TWO) TIMES DAILY WITH A MEAL., Disp: 180 tablet, Rfl: 1 ?  glucose blood (ONE TOUCH ULTRA TEST) test strip, Use as instructed, Disp: 100 each, Rfl: 12 ?  hydrochlorothiazide (HYDRODIURIL) 25 MG tablet, TAKE 1 TABLET BY MOUTH EVERY DAY, Disp: 90 tablet, Rfl: 1 ?  JARDIANCE 25 MG TABS tablet, TAKE 1 TABLET BY MOUTH EVERY DAY, Disp: 90 tablet, Rfl: 1 ?  losartan (COZAAR) 100 MG tablet, TAKE 1 TABLET BY MOUTH EVERY DAY, Disp: 90 tablet, Rfl: 1 ?  metFORMIN (GLUCOPHAGE-XR) 500 MG 24 hr tablet,  TAKE 2 TABLETS (1,000 MG TOTAL) BY MOUTH 2 (TWO) TIMES DAILY WITH A MEAL., Disp: 360 tablet, Rfl: 2 ? ?EXAM: ? ?VITALS per patient if applicable: ? ?GENERAL: alert, oriented, appears well and in no acute distress ? ?HEENT: atraumatic, conjunttiva clear, no obvious abnormalities on inspection of external nose and ears ? ?NECK: normal movements of the head and neck ? ?LUNGS: on inspection no signs of respiratory distress, breathing rate appears normal,  no obvious gross SOB, gasping or wheezing ? ?CV: no obvious cyanosis ? ?MS: moves all visible extremities without noticeable abnormality ? ?PSYCH/NEURO: pleasant and cooperative, no obvious depression or anxiety, speech and thought processing grossly intact ? ?ASSESSMENT AND PLAN: ? ?Discussed the following assessment and plan: ? ?Acute recurrent frontal sinusitis - Plan: amoxicillin-clavulanate (AUGMENTIN) 875-125 MG tablet ?Bid x 7-10 days ? ?-we discussed possible serious and likely etiologies, options for evaluation and workup, limitations of telemedicine visit vs in person visit, treatment, treatment risks and precautions. Pt is agreeable to treatment via telemedicine at this moment.  ? ?I discussed the assessment and treatment plan with the patient. The patient was provided an opportunity to ask questions and all were answered. The patient agreed with the plan and demonstrated an understanding of the instructions. ?  ? ?Time spent 15 minutes ? ?Nino Glow McLean-Scocuzza, MD   ?

## 2022-04-11 LAB — HM DIABETES EYE EXAM

## 2022-04-12 ENCOUNTER — Other Ambulatory Visit: Payer: Self-pay | Admitting: Family Medicine

## 2022-04-13 ENCOUNTER — Encounter: Payer: Self-pay | Admitting: Family Medicine

## 2022-04-14 MED ORDER — OZEMPIC (0.25 OR 0.5 MG/DOSE) 2 MG/1.5ML ~~LOC~~ SOPN: PEN_INJECTOR | SUBCUTANEOUS | 1 refills | Status: DC

## 2022-05-25 ENCOUNTER — Telehealth: Payer: Self-pay | Admitting: Family Medicine

## 2022-05-25 NOTE — Telephone Encounter (Signed)
I called to triage patient. As of now she is feeling okay just like she has a sinus infection. She has a stuffy nose & some congestion. No cough, no SOB, no fever, no HA. She is scheduled for VV tomorrow at 4:30 in cause VV needed for antiviral if symptoms worsen.

## 2022-05-25 NOTE — Telephone Encounter (Signed)
Tested positive for Covid needing paxlovid.

## 2022-05-26 ENCOUNTER — Telehealth (INDEPENDENT_AMBULATORY_CARE_PROVIDER_SITE_OTHER): Payer: BC Managed Care – PPO | Admitting: Family Medicine

## 2022-05-26 ENCOUNTER — Encounter: Payer: Self-pay | Admitting: Family Medicine

## 2022-05-26 DIAGNOSIS — U071 COVID-19: Secondary | ICD-10-CM | POA: Diagnosis not present

## 2022-05-26 MED ORDER — OZEMPIC (0.25 OR 0.5 MG/DOSE) 2 MG/1.5ML ~~LOC~~ SOPN: 0.5000 mg | PEN_INJECTOR | SUBCUTANEOUS | 1 refills | Status: DC

## 2022-05-26 MED ORDER — MOLNUPIRAVIR EUA 200MG CAPSULE: 4.0000 | ORAL_CAPSULE | Freq: Two times a day (BID) | ORAL | 0 refills | Status: AC

## 2022-06-14 ENCOUNTER — Other Ambulatory Visit: Payer: Self-pay | Admitting: Family Medicine

## 2022-07-10 ENCOUNTER — Other Ambulatory Visit: Payer: Self-pay | Admitting: Family Medicine

## 2022-08-09 ENCOUNTER — Other Ambulatory Visit: Payer: Self-pay | Admitting: Family Medicine

## 2022-09-11 ENCOUNTER — Other Ambulatory Visit: Payer: Self-pay | Admitting: Family

## 2022-09-13 ENCOUNTER — Other Ambulatory Visit: Payer: Self-pay | Admitting: Family Medicine

## 2022-09-15 ENCOUNTER — Other Ambulatory Visit: Payer: Self-pay | Admitting: Family Medicine

## 2022-09-15 MED ORDER — OZEMPIC (0.25 OR 0.5 MG/DOSE) 2 MG/3ML ~~LOC~~ SOPN: 0.5000 mg | PEN_INJECTOR | SUBCUTANEOUS | 0 refills | Status: DC

## 2022-09-26 ENCOUNTER — Encounter: Payer: Self-pay | Admitting: Family Medicine

## 2022-09-26 ENCOUNTER — Ambulatory Visit: Payer: BC Managed Care – PPO | Admitting: Family Medicine

## 2022-09-26 VITALS — BP 120/80 | HR 76 | Temp 98.6°F | Ht 63.0 in | Wt 138.0 lb

## 2022-09-26 DIAGNOSIS — Z23 Encounter for immunization: Secondary | ICD-10-CM

## 2022-09-26 DIAGNOSIS — E785 Hyperlipidemia, unspecified: Secondary | ICD-10-CM | POA: Diagnosis not present

## 2022-09-26 DIAGNOSIS — I10 Essential (primary) hypertension: Secondary | ICD-10-CM

## 2022-09-26 DIAGNOSIS — E1169 Type 2 diabetes mellitus with other specified complication: Secondary | ICD-10-CM | POA: Diagnosis not present

## 2022-09-26 DIAGNOSIS — E669 Obesity, unspecified: Secondary | ICD-10-CM | POA: Diagnosis not present

## 2022-09-26 LAB — MICROALBUMIN / CREATININE URINE RATIO
Creatinine,U: 85.2 mg/dL
Microalb Creat Ratio: 1.5 mg/g (ref 0.0–30.0)
Microalb, Ur: 1.3 mg/dL (ref 0.0–1.9)

## 2022-09-26 LAB — COMPREHENSIVE METABOLIC PANEL
ALT: 18 U/L (ref 0–35)
AST: 18 U/L (ref 0–37)
Albumin: 4.8 g/dL (ref 3.5–5.2)
Alkaline Phosphatase: 46 U/L (ref 39–117)
BUN: 14 mg/dL (ref 6–23)
CO2: 31 mEq/L (ref 19–32)
Calcium: 10.2 mg/dL (ref 8.4–10.5)
Chloride: 98 mEq/L (ref 96–112)
Creatinine, Ser: 0.57 mg/dL (ref 0.40–1.20)
GFR: 100.62 mL/min (ref >60.00)
Glucose, Bld: 94 mg/dL (ref 70–99)
Potassium: 4 mEq/L (ref 3.5–5.1)
Sodium: 138 mEq/L (ref 135–145)
Total Bilirubin: 0.8 mg/dL (ref 0.2–1.2)
Total Protein: 7.2 g/dL (ref 6.0–8.3)

## 2022-09-26 LAB — LIPID PANEL
Cholesterol: 108 mg/dL (ref 0–200)
HDL: 44.9 mg/dL (ref >39.00)
LDL Cholesterol: 42 mg/dL (ref 0–99)
NonHDL: 63
Total CHOL/HDL Ratio: 2
Triglycerides: 103 mg/dL (ref 0.0–149.0)
VLDL: 20.6 mg/dL (ref 0.0–40.0)

## 2022-09-26 LAB — HEMOGLOBIN A1C: Hgb A1c MFr Bld: 6.3 % (ref 4.6–6.5)

## 2022-09-26 NOTE — Assessment & Plan Note (Signed)
Adequately controlled when she checks it at home.  I encouraged her to check it at least twice a week so that we know that it is adequately controlled all the time.  She will continue carvedilol 25 mg twice daily, HCTZ 25 mg daily, losartan 100 mg daily.

## 2022-09-26 NOTE — Patient Instructions (Signed)
Nice to see you. We will check labs.  

## 2022-09-26 NOTE — Assessment & Plan Note (Signed)
Check A1c.  Continue Jardiance 25 mg daily, metformin XR 1000 mg twice daily, and Ozempic 0.5 mg weekly.

## 2022-09-26 NOTE — Assessment & Plan Note (Signed)
Check lipid panel.  Continue Lipitor 40 mg daily. 

## 2022-09-26 NOTE — Progress Notes (Signed)
Kathryn Rumps, MD Phone: [phone]  Kathryn Massey is a 56 y.o. female who presents today for f/u.  DIABETES Disease Monitoring: Blood Sugar ranges-82-120s Polyuria/phagia/dipsia- no      Optho- UTD Medications: Compliance- taking metformin, jardiance, ozempic Hypoglycemic symptoms- no  HYPERTENSION Disease Monitoring Home BP Monitoring 120s/60s most recently, though not checking often  Chest pain- no    Dyspnea- no Medications Compliance-  taking coreg, HCTZ, losartan.  Edema- no BMET    Component Value Date/Time   NA 138 03/27/2022 1500   K 4.4 03/27/2022 1500   CL 98 03/27/2022 1500   CO2 29 03/27/2022 1500   GLUCOSE 97 03/27/2022 1500   BUN 17 03/27/2022 1500   CREATININE 0.66 03/27/2022 1500   CALCIUM 10.2 03/27/2022 1500   GFRNONAA >60 05/26/2015 1147   GFRAA >60 05/26/2015 1147     Social History   Tobacco Use  Smoking Status Never  Smokeless Tobacco Never    Current Outpatient Medications on File Prior to Visit  Medication Sig Dispense Refill   atorvastatin (LIPITOR) 40 MG tablet TAKE 1 TABLET BY MOUTH EVERY DAY 90 tablet 3   Blood Glucose Monitoring Suppl (ONE TOUCH ULTRA SYSTEM KIT) W/DEVICE KIT 1 kit by Does not apply route once. 1 each 0   carvedilol (COREG) 25 MG tablet TAKE 1 TABLET (25 MG TOTAL) BY MOUTH TWICE A DAY WITH MEALS 180 tablet 1   glucose blood (ONE TOUCH ULTRA TEST) test strip Use as instructed 100 each 12   hydrochlorothiazide (HYDRODIURIL) 25 MG tablet TAKE 1 TABLET BY MOUTH EVERY DAY 90 tablet 1   JARDIANCE 25 MG TABS tablet TAKE 1 TABLET BY MOUTH EVERY DAY 90 tablet 1   losartan (COZAAR) 100 MG tablet TAKE 1 TABLET BY MOUTH EVERY DAY 90 tablet 1   metFORMIN (GLUCOPHAGE-XR) 500 MG 24 hr tablet TAKE 2 TABLETS (1,000 MG TOTAL) BY MOUTH 2 (TWO) TIMES DAILY WITH A MEAL. 360 tablet 2   Semaglutide,0.25 or 0.5MG/DOS, (OZEMPIC, 0.25 OR 0.5 MG/DOSE,) 2 MG/3ML SOPN Inject 0.5 mg into the skin once a week. 2 mL 0   No current  facility-administered medications on file prior to visit.     ROS see history of present illness  Objective  Physical Exam Vitals:   09/26/22 1000 09/26/22 1011  BP: (!) 140/70 120/80  Pulse: 76   Temp: 98.6 F (37 C)   SpO2: 97%     BP Readings from Last 3 Encounters:  09/26/22 120/80  03/27/22 134/76  10/13/21 120/70   Wt Readings from Last 3 Encounters:  09/26/22 138 lb (62.6 kg)  05/26/22 161 lb (73 kg)  03/27/22 161 lb 12.8 oz (73.4 kg)    Physical Exam Constitutional:      General: She is not in acute distress.    Appearance: She is not diaphoretic.  Cardiovascular:     Rate and Rhythm: Normal rate and regular rhythm.     Heart sounds: Normal heart sounds.  Pulmonary:     Effort: Pulmonary effort is normal.     Breath sounds: Normal breath sounds.  Skin:    General: Skin is warm and dry.  Neurological:     Mental Status: She is alert.      Assessment/Plan: Please see individual problem list.  Problem List Items Addressed This Visit     Diabetes mellitus type 2 in obese (HCC) (Chronic)    Check A1c.  Continue Jardiance 25 mg daily, metformin XR 1000 mg twice daily, and  Ozempic 0.5 mg weekly.      Relevant Orders   Comp Met (CMET)   Lipid panel   Urine Microalbumin w/creat. ratio   HgB A1c   Hyperlipidemia (Chronic)    Check lipid panel.  Continue Lipitor 40 mg daily.      Relevant Orders   Comp Met (CMET)   Lipid panel   Hypertension - Primary (Chronic)    Adequately controlled when she checks it at home.  I encouraged her to check it at least twice a week so that we know that it is adequately controlled all the time.  She will continue carvedilol 25 mg twice daily, HCTZ 25 mg daily, losartan 100 mg daily.      Relevant Orders   Comp Met (CMET)   Lipid panel   Other Visit Diagnoses     Need for immunization against influenza       Relevant Orders   Flu Vaccine QUAD 2moIM (Fluarix, Fluzone & Alfiuria Quad PF) (Completed)        Return in about 6 months (around 03/28/2023) for CPE.   ETommi Rumps MD [place]

## 2022-10-09 ENCOUNTER — Other Ambulatory Visit: Payer: Self-pay | Admitting: Family Medicine

## 2022-10-10 MED ORDER — OZEMPIC (0.25 OR 0.5 MG/DOSE) 2 MG/3ML ~~LOC~~ SOPN: 0.5000 mg | PEN_INJECTOR | SUBCUTANEOUS | 0 refills | Status: DC

## 2022-11-06 ENCOUNTER — Other Ambulatory Visit: Payer: Self-pay | Admitting: Family Medicine

## 2022-11-08 MED ORDER — OZEMPIC (0.25 OR 0.5 MG/DOSE) 2 MG/3ML ~~LOC~~ SOPN: 0.5000 mg | PEN_INJECTOR | SUBCUTANEOUS | 0 refills | Status: DC

## 2022-12-04 ENCOUNTER — Other Ambulatory Visit: Payer: Self-pay | Admitting: Family Medicine

## 2022-12-05 MED ORDER — OZEMPIC (0.25 OR 0.5 MG/DOSE) 2 MG/3ML ~~LOC~~ SOPN: 0.5000 mg | PEN_INJECTOR | SUBCUTANEOUS | 0 refills | Status: DC

## 2022-12-10 ENCOUNTER — Other Ambulatory Visit: Payer: Self-pay | Admitting: Family Medicine

## 2022-12-12 ENCOUNTER — Other Ambulatory Visit: Payer: Self-pay | Admitting: Family Medicine

## 2022-12-27 ENCOUNTER — Ambulatory Visit: Payer: BC Managed Care – PPO | Admitting: Obstetrics

## 2023-01-01 ENCOUNTER — Other Ambulatory Visit: Payer: Self-pay | Admitting: Family Medicine

## 2023-01-02 MED ORDER — OZEMPIC (0.25 OR 0.5 MG/DOSE) 2 MG/3ML ~~LOC~~ SOPN: 0.5000 mg | PEN_INJECTOR | SUBCUTANEOUS | 0 refills | Status: DC

## 2023-01-29 ENCOUNTER — Other Ambulatory Visit: Payer: Self-pay | Admitting: Family Medicine

## 2023-01-30 ENCOUNTER — Ambulatory Visit (INDEPENDENT_AMBULATORY_CARE_PROVIDER_SITE_OTHER): Payer: BC Managed Care – PPO | Admitting: Obstetrics

## 2023-01-30 VITALS — BP 127/69 | HR 85 | Ht 63.0 in | Wt 131.1 lb

## 2023-01-30 DIAGNOSIS — Z01419 Encounter for gynecological examination (general) (routine) without abnormal findings: Secondary | ICD-10-CM

## 2023-01-30 DIAGNOSIS — Z1231 Encounter for screening mammogram for malignant neoplasm of breast: Secondary | ICD-10-CM

## 2023-01-30 NOTE — Progress Notes (Signed)
Gynecology Annual Exam  PCP: Leone Haven, MD  Chief Complaint:  Chief Complaint  Patient presents with   Gynecologic Exam    History of Present Illness:Patient is a 57 y.o. G3P3003 presents for annual exam. The patient has no complaints today. She works for the school system. She is married.  LMP: No LMP recorded (lmp unknown). Patient is postmenopausal.  The patient is sexually active. She does admit to low libido.She denies dyspareunia.  The patient does perform self breast exams.  There is no notable family history of breast or ovarian cancer in her family.  The patient wears seatbelts: yes.   The patient has regular exercise: yes.    The patient denies current symptoms of depression.     Review of Systems: Review of Systems  Constitutional:  Positive for weight loss.       She has been on ozempic and has lost a good deal of weight.  HENT: Negative.    Eyes: Negative.   Respiratory: Negative.    Cardiovascular: Negative.   Gastrointestinal: Negative.   Genitourinary: Negative.   Musculoskeletal: Negative.   Neurological: Negative.   Endo/Heme/Allergies: Negative.   Psychiatric/Behavioral: Negative.      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 Vickki Muff tendon release for plantar  fasciitis   PUBOVAGINAL SLING  2005   Dr. Amalia Hailey mesh sling for SVI    Gynecologic History:  No LMP recorded (lmp unknown). Patient is postmenopausal. Last Pap: Results were: in 2021 no abnormalities  Last mammogram: 2020 Results were: BI-RAD I  Obstetric History: [id number]  Family History:  Family History  Problem Relation Age of Onset   Hypertension Mother    Diabetes Mother    COPD Father    Hypertension Father    Cirrhosis Father 62       deceased   Hyperlipidemia Sister    Heart disease Brother 52       had bypass surgery   Diabetes Brother    Hypertension Brother    Lung cancer Maternal Uncle    Lung cancer Maternal Grandmother    Breast cancer Neg Hx     Social History:  Social History   Socioeconomic History   Marital status: Married    Spouse name: Richard Personnel officer"   Number of children: 3   Years of education: 12   Highest education level: Not on file  Occupational History   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     Work - Conservation officer, historic buildings, nutritionist   Diet - Healthy   Exercise - working/ cleaning at work since [place] Strain: Not on [employer] Insecurity: Not on file  Transportation Needs: Not on file  Physical Activity: Inactive (09/11/2019)   Exercise Vital Sign    Days of Exercise per Week: 0 days    Minutes of Exercise per Session: 0 min  Stress: No Stress Concern Present (05/03/2018)   [place]    Feeling of Stress : Not at all  Social  Connections: Not on file  Intimate Partner Violence: Not on file    Allergies:  No Known Allergies  Medications: Prior to Admission medications   Medication Sig Start Date End Date Taking? Authorizing Provider  atorvastatin (LIPITOR) 40 MG tablet TAKE 1 TABLET BY MOUTH EVERY DAY 09/13/22  Yes Leone Haven, MD  Blood Glucose Monitoring Suppl (ONE TOUCH ULTRA SYSTEM KIT) W/DEVICE KIT 1 kit by Does not apply route once. 11/21/12  Yes Jackolyn Confer, MD  carvedilol (COREG) 25 MG tablet TAKE 1 TABLET (25 MG TOTAL) BY MOUTH TWICE A DAY WITH MEALS 12/12/22  Yes Leone Haven, MD  empagliflozin (JARDIANCE) 25 MG TABS tablet TAKE 1 TABLET BY MOUTH EVERY DAY 12/13/22  Yes Leone Haven, MD  glucose blood (ONE TOUCH ULTRA TEST) test strip Use as instructed 01/05/20  Yes Leone Haven, MD  hydrochlorothiazide (HYDRODIURIL) 25 MG tablet TAKE 1 TABLET BY MOUTH EVERY DAY 12/13/22  Yes Leone Haven, MD  losartan (COZAAR) 100 MG tablet TAKE 1 TABLET BY MOUTH EVERY DAY 12/13/22  Yes Leone Haven, MD  metFORMIN (GLUCOPHAGE-XR) 500 MG 24 hr tablet TAKE 2 TABLETS (1,000 MG TOTAL) BY MOUTH 2 (TWO) TIMES DAILY WITH A MEAL. 12/13/22  Yes Leone Haven, MD  Semaglutide,0.25 or 0.'5MG'$ /DOS, (OZEMPIC, 0.25 OR 0.5 MG/DOSE,) 2 MG/3ML SOPN INJECT 0.5 MG INTO THE SKIN ONE TIME PER WEEK 01/29/23  Yes Leone Haven, MD    Physical Exam Vitals: Blood pressure 127/69, pulse 85, height '5\' 3"'$  (1.6 m), weight 131 lb 1.6 oz (59.5 kg).  General: NAD HEENT: normocephalic, anicteric Thyroid: no enlargement, no palpable nodules Pulmonary: No increased work of breathing, CTAB Cardiovascular: RRR, distal pulses 2+ Breast: Breast symmetrical, no tenderness, no palpable nodules or masses, no skin or nipple retraction present, no nipple discharge.  No axillary or supraclavicular lymphadenopathy. Abdomen: NABS, soft, non-tender, non-distended.  Umbilicus without lesions.  No hepatomegaly,  splenomegaly or masses palpable. No evidence of hernia  Genitourinary:  External: Normal external female genitalia.  Normal urethral meatus, normal Bartholin's and Skene's glands.    Vagina: Normal vaginal mucosa, no evidence of prolapse.    Cervix: Grossly normal in appearance, no bleeding  Uterus: Non-enlarged, mobile, normal contour.  No CMT  Adnexa: ovaries non-enlarged, no adnexal masses  Rectal: deferred  Lymphatic: no evidence of inguinal lymphadenopathy Extremities: no edema, erythema, or tenderness Neurologic: Grossly intact Psychiatric: mood appropriate, affect full  Female chaperone present for pelvic and breast  portions of the physical exam     Assessment: 57 y.o. G3P3003 routine annual exam  Plan: Problem List Items Addressed This Visit   None Visit Diagnoses     Breast  cancer screening by mammogram    -  Primary   Relevant Orders   MM DIGITAL SCREENING BILATERAL       1) Mammogram - recommend yearly screening mammogram.  Mammogram Was ordered today  2) STI screening  was notoffered and therefore not obtained  3) ASCCP guidelines and rational discussed.  Patient opts for every 5 years screening interval  4) Osteoporosis  - per USPTF routine screening DEXA at age 89   Consider FDA-approved medical therapies in postmenopausal women and men aged 60 years and older, based on the following: a) A hip or vertebral (clinical or morphometric) fracture b) T-score ? -2.5 at the femoral neck or spine after appropriate evaluation to exclude secondary causes C) Low bone mass (T-score between -1.0 and -2.5 at the femoral neck or spine) and a 10-year probability of a hip fracture ? 3% or a 10-year probability of a major osteoporosis-related fracture ? 20% based on the [place]-adapted WHO algorithm   5) Routine healthcare maintenance including cholesterol, diabetes screening discussed managed by PCP  6) Colonoscopy per her PCP.  Screening recommended starting at age 66 for  average risk individuals, age 67 for individuals deemed at increased risk (including African Americans) and recommended to continue until age 70.  For patient age 44-85 individualized approach is recommended.  Gold standard screening is via colonoscopy, Cologuard screening is an acceptable alternative for patient unwilling or unable to undergo colonoscopy.  "Colorectal cancer screening for average?risk adults: 2018 guideline update from the [place]: A Cancer Journal for Clinicians: May 02, 2017   7) Return in about 1 year (around 01/31/2024) for annual.  I have ordered her mammogram. Much verbal encouragement provided to her re her weight loss.    Imagene Riches, CNM  01/30/2023 2:06 PM   Mosetta Pigeon, [facility] 01/30/2023, 2:06 PM

## 2023-02-06 ENCOUNTER — Other Ambulatory Visit: Payer: Self-pay | Admitting: Obstetrics

## 2023-02-06 DIAGNOSIS — Z1231 Encounter for screening mammogram for malignant neoplasm of breast: Secondary | ICD-10-CM

## 2023-02-14 ENCOUNTER — Ambulatory Visit
Admission: RE | Admit: 2023-02-14 | Discharge: 2023-02-14 | Disposition: A | Discharge status: Home / Self Care | Payer: BC Managed Care – PPO | Source: Ambulatory Visit | Attending: Obstetrics | Admitting: Obstetrics

## 2023-02-14 DIAGNOSIS — Z1231 Encounter for screening mammogram for malignant neoplasm of breast: Secondary | ICD-10-CM | POA: Diagnosis present

## 2023-02-26 ENCOUNTER — Telehealth: Payer: Self-pay

## 2023-02-26 NOTE — Telephone Encounter (Signed)
Spoke with Kathryn Massey, she called stating she thinks her bladder is hanging out. She felt the need to be seen sooner.

## 2023-02-27 ENCOUNTER — Encounter: Payer: Self-pay | Admitting: Obstetrics

## 2023-02-27 ENCOUNTER — Ambulatory Visit: Payer: BC Managed Care – PPO | Admitting: Obstetrics

## 2023-02-27 VITALS — BP 120/77 | HR 74 | Wt 129.2 lb

## 2023-02-27 DIAGNOSIS — N819 Female genital prolapse, unspecified: Secondary | ICD-10-CM

## 2023-02-27 DIAGNOSIS — R102 Pelvic and perineal pain: Secondary | ICD-10-CM | POA: Diagnosis not present

## 2023-02-27 LAB — POCT URINALYSIS DIPSTICK
Bilirubin, UA: NEGATIVE
Glucose, UA: POSITIVE — AB
Ketones, UA: NEGATIVE
Leukocytes, UA: NEGATIVE
Protein, UA: NEGATIVE
Spec Grav, UA: 1.015 (ref 1.010–1.025)
Urobilinogen, UA: 0.2 E.U./dL
pH, UA: 5 (ref 5.0–8.0)

## 2023-02-27 NOTE — Progress Notes (Signed)
Obstetrics & Gynecology Office Visit   Chief Complaint:  Chief Complaint  Patient presents with   Gynecologic Exam    History of Present Illness: Kathryn Massey presents , complaining that "her bladder is "hanging out"". She has a hx of a pubovaginal mesh sling procedure , performed by Dr. Amalia Hailey. She shares that she started noticing her bladder buldging from her vagina about 1.5 weeks ago. Is feeling increased pressure there. She denies any vaginal bleeding or discharge.When she was urinating the other day, she felt as if something was bulging lower in her vagina. She took a Geologist, engineering to look and saw tissue at the introitus. She manually pushed it back in place. [name] was recently seen for her annual PE, and di not have this as a complaint  She mentions that she has lost a great deal of weight on Ozempic, and is aware that people lose muscle being on this medication. She is wondering whether the loss of muscle tone may be an influence on these new symptoms. Review of Systems:  Review of Systems  Constitutional: Negative.   HENT: Negative.    Eyes: Negative.   Respiratory: Negative.    Cardiovascular: Negative.   Gastrointestinal: Negative.   Genitourinary: Negative.   Musculoskeletal: Negative.   Skin: Negative.   Neurological: Negative.   Endo/Heme/Allergies: Negative.   Psychiatric/Behavioral: Negative.       Past Medical History:  Past Medical History:  Diagnosis Date   Diabetes mellitus without complication ([place])    borderline   DVT femoral (deep venous thrombosis) with thrombophlebitis, left ([place]) 2004   had boot on left LE after foot surgery   Hypertension    Liver disorder 2010   mild liver enzyme elevation   Osteopenia 2016   mild DEXA scores -1.4, -1.5    Past Surgical History:  Past Surgical History:  Procedure Laterality Date   BREAST CYST EXCISION Right 07/04/2021   cyst removed   COLONOSCOPY  2010   benign polyps   COLONOSCOPY WITH PROPOFOL N/A 09/27/2015    Procedure: COLONOSCOPY WITH PROPOFOL;  Surgeon: Lollie Sails, MD;  Location: [facility] ENDOSCOPY;  Service: Endoscopy;  Laterality: N/A;   FOOT SURGERY Left 2004   Dr. Vickki Muff tendon release for plantar fasciitis   PUBOVAGINAL SLING  2005   Dr. Amalia Hailey mesh sling for SVI    Gynecologic History: No LMP recorded (lmp unknown). Patient is postmenopausal.  Obstetric History: Kathryn Massey  Family History:  Family History  Problem Relation Age of Onset   Hypertension Mother    Diabetes Mother    COPD Father    Hypertension Father    Cirrhosis Father 24       deceased   Hyperlipidemia Sister    Heart disease Brother 74       had bypass surgery   Diabetes Brother    Hypertension Brother    Lung cancer Maternal Uncle    Lung cancer Maternal Grandmother    Breast cancer Neg Hx     Social History:  Social History   Socioeconomic History   Marital status: Married    Spouse name: Kathryn Massey Personnel officer"   Number of children: 3   Years of education: 12   Highest education level: Not on file  Occupational History   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     Work - Conservation officer, historic buildings, nutritionist   Diet - Healthy   Exercise - working/ cleaning at work since [employer] Strain: Not on [employer] Insecurity: Not on file  Transportation Needs: Not on file  Physical Activity: Inactive (09/11/2019)   Exercise Vital Sign    Days of Exercise per Week: 0 days    Minutes of Exercise per Session: 0 min  Stress: No Stress Concern Present (05/03/2018)   Kathryn Massey    Feeling of Stress : Not at all  Social Connections: Not on file  Intimate Partner Violence: Not on file    Allergies:  No Known Allergies  Medications: Prior to Admission medications   Medication Sig Start Date End Date Taking? Authorizing Provider  atorvastatin (LIPITOR) 40 MG tablet TAKE 1 TABLET BY MOUTH EVERY DAY 09/13/22  Yes Leone Haven, MD  Blood Glucose Monitoring Suppl (ONE TOUCH ULTRA SYSTEM KIT) W/DEVICE KIT 1 kit by Does not apply route once. 11/21/12  Yes Jackolyn Confer, MD  carvedilol (COREG) 25 MG tablet TAKE 1 TABLET (25 MG TOTAL) BY MOUTH TWICE A DAY WITH MEALS 12/12/22  Yes Leone Haven, MD  empagliflozin (JARDIANCE) 25 MG TABS tablet TAKE 1 TABLET BY MOUTH EVERY DAY 12/13/22  Yes Leone Haven, MD  glucose blood (ONE TOUCH ULTRA TEST) test strip Use as instructed 01/05/20  Yes Leone Haven, MD  hydrochlorothiazide (HYDRODIURIL) 25 MG tablet TAKE 1 TABLET BY MOUTH EVERY DAY 12/13/22  Yes Leone Haven, MD  losartan (COZAAR) 100 MG tablet TAKE 1 TABLET BY MOUTH EVERY DAY 12/13/22  Yes Leone Haven, MD  metFORMIN (GLUCOPHAGE-XR) 500 MG 24 hr tablet TAKE 2 TABLETS (1,000 MG TOTAL) BY MOUTH 2 (TWO) TIMES DAILY WITH A MEAL. 12/13/22  Yes Leone Haven, MD  Semaglutide,0.25 or 0.5MG /DOS, (OZEMPIC, 0.25 OR 0.5 MG/DOSE,) 2 MG/3ML SOPN INJECT 0.5 MG INTO THE SKIN ONE TIME PER WEEK 01/29/23  Yes Leone Haven, MD    Physical Exam Vitals:  Vitals:   02/27/23 1108  BP: 120/77  Pulse: 74   No LMP recorded (lmp unknown). Patient is postmenopausal.  Physical Exam Constitutional:      Appearance: Normal appearance. She is normal weight.  HENT:     Head: Normocephalic and atraumatic.  Cardiovascular:     Rate and Rhythm: Normal rate and regular rhythm.     Pulses: Normal pulses.     Heart sounds: Normal heart sounds.  Pulmonary:     Effort: Pulmonary effort is normal.      Breath sounds: Normal breath sounds.  Abdominal:     Palpations: Abdomen is soft.  Genitourinary:      Comments: Bimanual exam: cystocele noted, and her uterus feels somewhat prolapsed. Cervix is palpated aneriorly, resting against her bladder wall. Some Kegel strength noted, but diminished. No evidence of uterine prolapse noted visually when she strains. No leaking of urine when she bears down Skin:    General: Skin is warm and dry.  Neurological:     General: No focal deficit present.     Mental Status:  She is alert and oriented to person, place, and time.  Psychiatric:        Mood and Affect: Mood normal.        Behavior: Behavior normal.      Assessment: 57 y.o. [id number] with Pelvic organ prolapse Hx of pubovaginal mesh placement Plan: Problem List Items Addressed This Visit   None  We discussed referral to Dr. Jeannie Fend for further evaluation and recommendations. Discussed the possible treatment options depending on what Dr. Amalia Hailey determines- use of pessary or aditional sling procedures mentioned.We will make an appointment for her to meet with him in the next few weeks. She is advised not to strain when having a BM; to contact the office  with any futher concerns/symptoms. Urine sent for culture seconday dip results today.  Imagene Riches, CNM  02/27/2023 11:58 AM

## 2023-03-01 LAB — URINE CULTURE

## 2023-03-21 ENCOUNTER — Ambulatory Visit: Payer: BC Managed Care – PPO | Admitting: Obstetrics

## 2023-03-22 ENCOUNTER — Other Ambulatory Visit: Payer: Self-pay | Admitting: Family Medicine

## 2023-03-22 ENCOUNTER — Encounter: Payer: Self-pay | Admitting: Obstetrics and Gynecology

## 2023-03-22 ENCOUNTER — Ambulatory Visit: Payer: BC Managed Care – PPO | Admitting: Obstetrics and Gynecology

## 2023-03-22 ENCOUNTER — Other Ambulatory Visit (HOSPITAL_COMMUNITY)
Admission: RE | Admit: 2023-03-22 | Discharge: 2023-03-22 | Disposition: A | Discharge status: Home / Self Care | Payer: BC Managed Care – PPO | Source: Ambulatory Visit | Attending: Obstetrics and Gynecology | Admitting: Obstetrics and Gynecology

## 2023-03-22 VITALS — BP 132/80 | HR 76 | Ht 63.0 in | Wt 131.7 lb

## 2023-03-22 DIAGNOSIS — N898 Other specified noninflammatory disorders of vagina: Secondary | ICD-10-CM | POA: Diagnosis not present

## 2023-03-22 DIAGNOSIS — R102 Pelvic and perineal pain: Secondary | ICD-10-CM | POA: Diagnosis not present

## 2023-03-22 DIAGNOSIS — N819 Female genital prolapse, unspecified: Secondary | ICD-10-CM

## 2023-03-22 DIAGNOSIS — N816 Rectocele: Secondary | ICD-10-CM | POA: Diagnosis not present

## 2023-03-22 DIAGNOSIS — B3731 Acute candidiasis of vulva and vagina: Secondary | ICD-10-CM | POA: Diagnosis not present

## 2023-03-22 DIAGNOSIS — N814 Uterovaginal prolapse, unspecified: Secondary | ICD-10-CM

## 2023-03-22 NOTE — Progress Notes (Signed)
Patient presents to discuss bladder prolapse. She states two months ago noticing a bulge and having issues with urination. She states no pain or discomfort at this time. History of pubovaginal sling in 2005.

## 2023-03-22 NOTE — Progress Notes (Signed)
HPI:      [name] is a 57 y.o. [phone] who LMP was No LMP recorded (lmp unknown). Patient is postmenopausal.  Subjective:   She presents today with complaint of something bulging at the vagina.  She says this has been going on for several months.  She sometimes has trouble starting her urine stream and has to push on her bladder to get it to start.  She previously had a urethral suspension procedure performed.  She reports no leakage of urine.  She reports no problems with bowel movements.  Her main issue is the part that is bulging and the fact that it requires effort and sometimes manipulation to begin to start urination. She does complain of many months of vulvar itching despite use of antifungal creams.    Hx: The following portions of the patient's history were reviewed and updated as appropriate:             She  has a past medical history of Diabetes mellitus without complication, DVT femoral (deep venous thrombosis) with thrombophlebitis, left (2004), Hypertension, Liver disorder (2010), and Osteopenia (2016). She does not have any pertinent problems on file. She  has a past surgical history that includes Foot surgery (Left, 2004); Colonoscopy with propofol (N/A, 09/27/2015); Colonoscopy (2010); Pubovaginal sling (2005); and Breast cyst excision (Right, 07/04/2021). Her family history includes COPD in her father; Cirrhosis (age of onset: 35) in her father; Diabetes in her brother and mother; Heart disease (age of onset: 5) in her brother; Hyperlipidemia in her sister; Hypertension in her brother, father, and mother; Lung cancer in her maternal grandmother and maternal uncle. She  reports that she has never smoked. She has never used smokeless tobacco. She reports current alcohol use. She reports that she does not use drugs. She has a current medication list which includes the following prescription(s): atorvastatin, one touch ultra system kit, carvedilol, jardiance, glucose  blood, hydrochlorothiazide, losartan, metformin, and ozempic (0.25 or 0.5 mg/dose). She has No Known Allergies.       Review of Systems:  Review of Systems  Constitutional: Denied constitutional symptoms, night sweats, recent illness, fatigue, fever, insomnia and weight loss.  Eyes: Denied eye symptoms, eye pain, photophobia, vision change and visual disturbance.  Ears/Nose/Throat/Neck: Denied ear, nose, throat or neck symptoms, hearing loss, nasal discharge, sinus congestion and sore throat.  Cardiovascular: Denied cardiovascular symptoms, arrhythmia, chest pain/pressure, edema, exercise intolerance, orthopnea and palpitations.  Respiratory: Denied pulmonary symptoms, asthma, pleuritic pain, productive sputum, cough, dyspnea and wheezing.  Gastrointestinal: Denied, gastro-esophageal reflux, melena, nausea and vomiting.  Genitourinary: See HPI for additional information.  Musculoskeletal: Denied musculoskeletal symptoms, stiffness, swelling, muscle weakness and myalgia.  Dermatologic: Denied dermatology symptoms, rash and scar.  Neurologic: Denied neurology symptoms, dizziness, headache, neck pain and syncope.  Psychiatric: Denied psychiatric symptoms, anxiety and depression.  Endocrine: Denied endocrine symptoms including hot flashes and night sweats.   Meds:   Current Outpatient Medications on File Prior to Visit  Medication Sig Dispense Refill   atorvastatin (LIPITOR) 40 MG tablet TAKE 1 TABLET BY MOUTH EVERY DAY 90 tablet 3   Blood Glucose Monitoring Suppl (ONE TOUCH ULTRA SYSTEM KIT) W/DEVICE KIT 1 kit by Does not apply route once. 1 each 0   carvedilol (COREG) 25 MG tablet TAKE 1 TABLET (25 MG TOTAL) BY MOUTH TWICE A DAY WITH MEALS 180 tablet 1   empagliflozin (JARDIANCE) 25 MG TABS tablet TAKE 1 TABLET BY MOUTH EVERY DAY 90 tablet 2   glucose  blood (ONE TOUCH ULTRA TEST) test strip Use as instructed 100 each 12   hydrochlorothiazide (HYDRODIURIL) 25 MG tablet TAKE 1 TABLET BY MOUTH  EVERY DAY 90 tablet 2   losartan (COZAAR) 100 MG tablet TAKE 1 TABLET BY MOUTH EVERY DAY 90 tablet 2   metFORMIN (GLUCOPHAGE-XR) 500 MG 24 hr tablet TAKE 2 TABLETS (1,000 MG TOTAL) BY MOUTH 2 (TWO) TIMES DAILY WITH A MEAL. 360 tablet 2   Semaglutide,0.25 or 0.5MG /DOS, (OZEMPIC, 0.25 OR 0.5 MG/DOSE,) 2 MG/3ML SOPN INJECT 0.5 MG INTO THE SKIN ONE TIME PER WEEK 3 mL 1   No current facility-administered medications on file prior to visit.      Objective:     Vitals:   03/22/23 1356  BP: 132/80  Pulse: 76   Filed Weights   03/22/23 1356  Weight: 131 lb 11.2 oz (59.7 kg)              Physical examination   Pelvic:   Vulva: Erythematous excoriated edematous irritated  Vagina: No lesions or abnormalities noted.  Support: Third-degree cystocele second-degree uterine prolapse minimal posterior relaxation  Urethra No masses tenderness or scarring.  Meatus Normal size without lesions or prolapse.  Cervix: Normal appearance.  No lesions.  Anus: Normal exam.  No lesions.  Perineum: Normal exam.  No lesions.        Bimanual   Uterus: Normal size.  Non-tender.  Mobile.  AV.  Adnexae: No masses.  Non-tender to palpation.  Cul-de-sac: Negative for abnormality.             Assessment:    [id number] Patient Active Problem List   Diagnosis Date Noted   COVID-19 05/26/2022   Hyperlipidemia 11/20/2016   Osteopenia 07/15/2015   Chronic meniscal tear of knee 07/13/2014   Diabetes mellitus type 2 in obese 12/13/2012   Hypertension 04/17/2012   Overweight (BMI 25.0-29.9) 04/17/2012     1. Female genital prolapse, unspecified type   2. Pelvic pressure in female   3. Cystocele with uterine prolapse   4. Vaginal discharge   5. Vagina itching   6. Monilial vulvovaginitis     Patient appears to have significant vulvar itching and burning.  It appears to be monilia although it could be lichen sclerosis.   Significant cystocele and urinary issues and some of the has previously had a  urethral sling placed.  Prolapse is present.  Urinary hesitancy.   Plan:            1.  Nuswab performed consider long-term use of Diflucan if chronic yeast if yeast negative strongly recommend use of clobetasol appropriately for lichen sclerosus  2.  Patient considering surgery for cystocele.  Would recommend hysterectomy as well as anterior repair.  The difficulty of a repeat anterior repair in the presence of a previously placed vaginal sling was discussed in detail with the patient.  Possible referral to other sources like urogynecology or a large center like [facility] or [facility].  Patient will consider her options and decide in the future. Orders No orders of the defined types were placed in this encounter.   No orders of the defined types were placed in this encounter.     F/U  Return in about 3 months (around 06/21/2023) for We will contact her with any abnormal test results. I spent 32 minutes involved in the care of this patient preparing to see the patient by obtaining and reviewing her medical history (including labs, imaging tests and prior procedures), documenting clinical  information in the electronic health record (EHR), counseling and coordinating care plans, writing and sending prescriptions, ordering tests or procedures and in direct communicating with the patient and medical staff discussing pertinent items from her history and physical exam.  Elonda Husky, M.D. 03/22/2023 2:27 PM

## 2023-03-26 ENCOUNTER — Other Ambulatory Visit: Payer: Self-pay

## 2023-03-26 DIAGNOSIS — B379 Candidiasis, unspecified: Secondary | ICD-10-CM

## 2023-03-26 DIAGNOSIS — N898 Other specified noninflammatory disorders of vagina: Secondary | ICD-10-CM

## 2023-03-26 LAB — CERVICOVAGINAL ANCILLARY ONLY
Bacterial Vaginitis (gardnerella): NEGATIVE
Candida Glabrata: POSITIVE — AB
Candida Vaginitis: POSITIVE — AB
Chlamydia: NEGATIVE
Comment: NEGATIVE
Comment: NEGATIVE
Comment: NEGATIVE
Comment: NEGATIVE
Comment: NEGATIVE
Comment: NORMAL
Neisseria Gonorrhea: NEGATIVE
Trichomonas: NEGATIVE

## 2023-03-26 MED ORDER — FLUCONAZOLE 150 MG PO TABS: 150.0000 mg | ORAL_TABLET | Freq: Every day | ORAL | 0 refills | Status: AC

## 2023-03-28 ENCOUNTER — Encounter: Payer: Self-pay | Admitting: Family Medicine

## 2023-03-28 ENCOUNTER — Ambulatory Visit (INDEPENDENT_AMBULATORY_CARE_PROVIDER_SITE_OTHER): Payer: BC Managed Care – PPO | Admitting: Family Medicine

## 2023-03-28 VITALS — BP 122/78 | HR 85 | Temp 98.2°F | Ht 63.0 in | Wt 129.0 lb

## 2023-03-28 DIAGNOSIS — Z0001 Encounter for general adult medical examination with abnormal findings: Secondary | ICD-10-CM

## 2023-03-28 DIAGNOSIS — I10 Essential (primary) hypertension: Secondary | ICD-10-CM

## 2023-03-28 DIAGNOSIS — E119 Type 2 diabetes mellitus without complications: Secondary | ICD-10-CM

## 2023-03-28 DIAGNOSIS — N814 Uterovaginal prolapse, unspecified: Secondary | ICD-10-CM

## 2023-03-28 NOTE — Assessment & Plan Note (Signed)
Refer to urogynecology at [facility] per patient request.

## 2023-03-28 NOTE — Progress Notes (Signed)
Marikay Alar, MD Phone: [phone]  Kathryn Massey is a 57 y.o. female who presents today for CPE.  Diet: not as much fried food Exercise: weights/bands 3x/week Pap smear: 09/13/20 neg HPV, NILM Colonoscopy: 09/27/15 10 year recall Mammogram: 02/14/23 negative Family history-  Colon cancer: no  Breast cancer: no  Ovarian cancer: no Menses: postmenopausal Vaccines-   Flu: UTD  Tetanus: UTD  Shingles: UTD  COVID19: UTD HIV screening: declines Hep C Screening: UTD Tobacco use: no Alcohol use: rare Illicit Drug use: no Dentist: yes Ophthalmology: yes Bladder prolapse: This has been going on recently.  She has seen gynecology for this.  They did recommend surgery for this and discussed possible referrals to [facility]gynecology at [facility].  Patient would like referral to [facility] urogynecology.   Active Ambulatory Problems    Diagnosis Date Noted   Hypertension 04/17/2012   Overweight (BMI 25.0-29.9) 04/17/2012   Type 2 diabetes mellitus 12/13/2012   Chronic meniscal tear of knee 07/13/2014   Osteopenia 07/15/2015   Hyperlipidemia 11/20/2016   COVID-19 05/26/2022   Encounter for general adult medical examination with abnormal findings 03/28/2023   Cystocele with prolapse 03/28/2023   Resolved Ambulatory Problems    Diagnosis Date Noted   Candidiasis 04/17/2012   General medical exam 04/17/2012   Elevated LFTs 05/21/2012   Elevated blood sugar 11/21/2012   Vaginal candidiasis 11/21/2012   Acute maxillary sinusitis 12/13/2012   Left knee pain 07/07/2014   Special screening for malignant neoplasms, colon 07/15/2015   New onset of headaches after age 61 06/23/2016   Pharyngitis due to Streptococcus species 08/26/2017   Diarrhea 11/06/2017   Rib fracture 05/01/2019   Right knee pain 09/01/2019   Right elbow pain 03/02/2021   Grief 03/02/2021   Past Medical History:  Diagnosis Date   Diabetes mellitus without complication    DVT femoral (deep venous  thrombosis) with thrombophlebitis, left 2004   Liver disorder 2010    Family History  Problem Relation Age of Onset   Hypertension Mother    Diabetes Mother    COPD Father    Hypertension Father    Cirrhosis Father 73       deceased   Hyperlipidemia Sister    Heart disease Brother 79       had bypass surgery   Diabetes Brother    Hypertension Brother    Lung cancer Maternal Uncle    Lung cancer Maternal Grandmother    Breast cancer Neg Hx     Social History   Socioeconomic History   Marital status: Married    Spouse name: Richard Psychologist, occupational"   Number of children: 3   Years of education: 12   Highest education level: Not on file  Occupational History   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     Work - Public affairs consultant, nutritionist   Diet - Healthy   Exercise - working/ cleaning at work since [employer]: Not on [employer]  Insecurity: Not on file  Transportation Needs: Not on file  Physical Activity: Inactive (09/11/2019)   Exercise Vital Sign    Days of Exercise per Week: 0 days    Minutes of Exercise per Session: 0 min  Stress: No Stress Concern Present (05/03/2018)   [employer] of Occupational Health - Occupational Stress Questionnaire    Feeling of Stress : Not at all  Social Connections: Not on file  Intimate Partner Violence: Not on file    ROS  General:  Negative for nexplained weight loss, fever Skin: Negative for new or changing mole, sore that won't heal HEENT: Negative for trouble hearing, trouble seeing, ringing  in ears, mouth sores, hoarseness, change in voice, dysphagia. CV:  Negative for chest pain, dyspnea, edema, palpitations Resp: Negative for cough, dyspnea, hemoptysis GI: Negative for nausea, vomiting, diarrhea, constipation, abdominal pain, melena, hematochezia. GU: Negative for dysuria, incontinence, urinary hesitance, hematuria, vaginal or penile discharge, polyuria, sexual difficulty, lumps in testicle or breasts MSK: Negative for muscle cramps or aches, joint pain or swelling Neuro: Negative for headaches, weakness, numbness, dizziness, passing out/fainting Psych: Negative for depression, anxiety, memory problems  Objective  Physical Exam Vitals:   03/28/23 1536  BP: 122/78  Pulse: 85  Temp: 98.2 F (36.8 C)  SpO2: 98%    BP Readings from Last 3 Encounters:  03/28/23 122/78  03/22/23 132/80  02/27/23 120/77   Wt Readings from Last 3 Encounters:  03/28/23 129 lb (58.5 kg)  03/22/23 131 lb 11.2 oz (59.7 kg)  02/27/23 129 lb 3.2 oz (58.6 kg)    Physical Exam Constitutional:      General: She is not in acute distress.    Appearance: She is not diaphoretic.  HENT:     Head: Normocephalic and atraumatic.  Cardiovascular:     Rate and Rhythm: Normal rate and regular rhythm.     Heart sounds: Normal heart sounds.  Pulmonary:     Effort: Pulmonary effort is normal.     Breath sounds: Normal breath sounds.  Abdominal:     General: Bowel sounds are normal. There is no distension.     Palpations: Abdomen is soft.     Tenderness: There is no abdominal tenderness.  Musculoskeletal:     Right lower leg: No edema.     Left lower leg: No edema.  Lymphadenopathy:     Cervical: No cervical adenopathy.  Skin:    General: Skin is warm and dry.  Neurological:     Mental Status: She is alert.  Psychiatric:        Mood and Affect: Mood normal.      Assessment/Plan:   Encounter for general adult medical examination with abnormal findings Assessment & Plan: Physical exam  completed.  Encouraged healthy diet and exercise.  Congratulated on weight loss.  Cancer screening is up-to-date.  Vaccines are up-to-date.  Lab work as outlined.  Patient declines HIV screening.   Cystocele with prolapse Assessment & Plan: Refer to urogynecology at [facility] per patient request.  Orders: -     Ambulatory referral to Urogynecology  Primary hypertension -     Basic metabolic panel  Type 2 diabetes mellitus without complication, without long-term current use of insulin -     Hemoglobin A1c    Return in about 6 months (around 09/27/2023) for DM, HTN.   Marikay Alar, MD [facility] [employer]

## 2023-03-28 NOTE — Assessment & Plan Note (Signed)
Physical exam completed.  Encouraged healthy diet and exercise.  Congratulated on weight loss.  Cancer screening is up-to-date.  Vaccines are up-to-date.  Lab work as outlined.  Patient declines HIV screening.

## 2023-03-29 LAB — BASIC METABOLIC PANEL
BUN: 19 mg/dL (ref 6–23)
CO2: 31 mEq/L (ref 19–32)
Calcium: 10.1 mg/dL (ref 8.4–10.5)
Chloride: 99 mEq/L (ref 96–112)
Creatinine, Ser: 0.79 mg/dL (ref 0.40–1.20)
GFR: 82.53 mL/min (ref >60.00)
Glucose, Bld: 92 mg/dL (ref 70–99)
Potassium: 4.8 mEq/L (ref 3.5–5.1)
Sodium: 140 mEq/L (ref 135–145)

## 2023-03-29 LAB — HEMOGLOBIN A1C: Hgb A1c MFr Bld: 6.1 % (ref 4.6–6.5)

## 2023-04-23 LAB — HM DIABETES EYE EXAM

## 2023-05-14 ENCOUNTER — Other Ambulatory Visit: Payer: Self-pay | Admitting: Family Medicine

## 2023-06-09 ENCOUNTER — Other Ambulatory Visit: Payer: Self-pay | Admitting: Family Medicine

## 2023-07-06 ENCOUNTER — Other Ambulatory Visit: Payer: Self-pay | Admitting: Family Medicine

## 2023-07-14 ENCOUNTER — Other Ambulatory Visit: Payer: Self-pay | Admitting: Family Medicine

## 2023-08-14 ENCOUNTER — Telehealth: Payer: Self-pay

## 2023-08-14 ENCOUNTER — Other Ambulatory Visit (HOSPITAL_COMMUNITY): Payer: Self-pay

## 2023-08-14 NOTE — Telephone Encounter (Signed)
Pharmacy Patient Advocate Encounter   Received notification from CoverMyMeds that prior authorization for ozempic is required/requested.   Insurance verification completed.   The patient is insured through [facility] .   Per test claim: PA required; PA submitted to [facility] via CoverMyMeds Key/confirmation #/EOC (Key: [id number] Status is pending

## 2023-08-14 NOTE — Telephone Encounter (Signed)
Pharmacy Patient Advocate Encounter  Received notification from [employer] [facility] that Prior Authorization for Ozempic (0.25 or 0.5 MG/DOSE) 2MG /3ML pen-injectors has been DENIED.  Full denial letter will be uploaded to the media tab. See denial reason below.  Your plan only covers this drug when A) your A1c is sent to [place], and your test results are in a certain range (A1C greater than or equal to 6.5 percent), B) your 2-hour plasma glucose(PG) during oral glucose tolerance test (OGTT) is sent to [place], and your results are in a certain rand (2-hour PG greater than or equal to 200mg /dL), C) your random plasma glucose is sent to [place], and your test results are in a certain range (random plasma glucose greater than or equal to 200mg /dL with symptoms of hyperglycemia (e.g., polyuria, polydipsia, polyphagia) or hyperglycemic crisis, or D) your fasting plasma glucose (FPG) is sent to [place], and your results are in a certain range (FPG greater than or equal to 126mg /dL). We denied your request because: A) We did not receive our results or B) Your results are not in the approvable range.   PA #/Case ID/Reference #: [id number]  Please be advised we currently do not have a Pharmacist to review denials, therefore you will need to process appeals accordingly as needed. Thanks for your support at this time. Contact for appeals are as follows: Phone: [phone], Fax: [phone]  Last day to appeal is 180 days of date of letter (08-14-2023)

## 2023-08-17 ENCOUNTER — Telehealth: Payer: Self-pay

## 2023-08-17 ENCOUNTER — Other Ambulatory Visit: Payer: Self-pay

## 2023-08-17 NOTE — Telephone Encounter (Signed)
Prescription Request  08/17/2023  LOV: Visit date not found  What is the name of the medication or equipment? Semaglutide,0.25 or 0.5MG /DOS, (OZEMPIC, 0.25 OR 0.5 MG/DOSE,) 2 MG/3ML SOPN  Have you contacted your pharmacy to request a refill? Yes   Which pharmacy would you like this sent to?  [id number] [place], [place] - [address] [place] [address] Phone: [phone] Fax: [phone]    Patient notified that their request is being sent to the clinical staff for review and that they should receive a response within 2 business days.   Please advise at Mobile [phone] (mobile)  Patient states she is out of this medication and is supposed to take it again on Sunday.  Patient states she called her pharmacy and they told her that a prior authorization is pending.  Patient is really concerned and would like for someone to call her.

## 2023-08-21 MED ORDER — OZEMPIC (0.25 OR 0.5 MG/DOSE) 2 MG/3ML ~~LOC~~ SOPN: PEN_INJECTOR | SUBCUTANEOUS | 1 refills | Status: DC

## 2023-08-21 NOTE — Telephone Encounter (Signed)
Patient called about her Ozempic Prior Auth, no one called her back.

## 2023-08-21 NOTE — Telephone Encounter (Signed)
Can we file an appeal for this?  When we started the Ozempic her A1c was 7.1.  Was this A1c provided to them?  I know her most recent A1c's are adequately controlled though when the Ozempic was started she was under good control.

## 2023-08-21 NOTE — Telephone Encounter (Signed)
Refill sent, pt notified via mychart.

## 2023-08-24 NOTE — Telephone Encounter (Signed)
See telephone note dated 08/14/23.

## 2023-08-24 NOTE — Telephone Encounter (Signed)
Faxed medication appeal letter and lab work from 03/27/22 to [facility], received confirmation. Called pt and let her know that has been done at this time. Will await decision from insurance company.  Faxed to [phone] per denial letter in media.

## 2023-08-24 NOTE — Telephone Encounter (Signed)
Patient just called and wants to know the update on her Ozempic. Her insurance needs a prior authorization from her provider. Her number is [phone].

## 2023-09-02 ENCOUNTER — Encounter: Payer: Self-pay | Admitting: Family Medicine

## 2023-09-06 ENCOUNTER — Other Ambulatory Visit: Payer: Self-pay | Admitting: Family Medicine

## 2023-09-13 ENCOUNTER — Telehealth: Payer: Self-pay

## 2023-09-13 NOTE — Telephone Encounter (Signed)
Pharmacy Patient Advocate Encounter  Received notification from [facility] that Prior Authorization for Ozempic (0.25 or 0.5 MG/DOSE) 2MG /3ML pen-injectors has been APPROVED from 09/13/2023 to 09/12/2026. Spoke to pharmacy to process.Copay is $24.99. Marland Kitchen   PA #/Case ID/Reference #: [id number] CD

## 2023-09-14 NOTE — Telephone Encounter (Signed)
Noted  

## 2023-09-14 NOTE — Telephone Encounter (Signed)
Submitted new PA 09/13/2023 as URGENT in attempt to resolve quickly, medication has been approved and pharmacy has med ready for pt for 24.99 and has already contacted pt for pickup.

## 2023-09-28 ENCOUNTER — Ambulatory Visit: Payer: BC Managed Care – PPO | Admitting: Family Medicine

## 2023-10-01 ENCOUNTER — Ambulatory Visit: Payer: BC Managed Care – PPO | Admitting: Family Medicine

## 2023-10-19 ENCOUNTER — Ambulatory Visit: Payer: BC Managed Care – PPO | Admitting: Family Medicine

## 2023-10-19 ENCOUNTER — Encounter: Payer: Self-pay | Admitting: Family Medicine

## 2023-10-19 VITALS — BP 114/76 | HR 75 | Temp 97.8°F | Ht 63.0 in | Wt 132.0 lb

## 2023-10-19 DIAGNOSIS — E119 Type 2 diabetes mellitus without complications: Secondary | ICD-10-CM

## 2023-10-19 DIAGNOSIS — I10 Essential (primary) hypertension: Secondary | ICD-10-CM

## 2023-10-19 DIAGNOSIS — Z7985 Long-term (current) use of injectable non-insulin antidiabetic drugs: Secondary | ICD-10-CM | POA: Diagnosis not present

## 2023-10-19 DIAGNOSIS — Z7984 Long term (current) use of oral hypoglycemic drugs: Secondary | ICD-10-CM | POA: Diagnosis not present

## 2023-10-19 LAB — COMPREHENSIVE METABOLIC PANEL
ALT: 19 U/L (ref 0–35)
AST: 20 U/L (ref 0–37)
Albumin: 4.6 g/dL (ref 3.5–5.2)
Alkaline Phosphatase: 62 U/L (ref 39–117)
BUN: 15 mg/dL (ref 6–23)
CO2: 32 meq/L (ref 19–32)
Calcium: 10.1 mg/dL (ref 8.4–10.5)
Chloride: 96 meq/L (ref 96–112)
Creatinine, Ser: 0.7 mg/dL (ref 0.40–1.20)
GFR: 95.05 mL/min (ref >60.00)
Glucose, Bld: 91 mg/dL (ref 70–99)
Potassium: 4 meq/L (ref 3.5–5.1)
Sodium: 137 meq/L (ref 135–145)
Total Bilirubin: 0.7 mg/dL (ref 0.2–1.2)
Total Protein: 7.2 g/dL (ref 6.0–8.3)

## 2023-10-19 LAB — MICROALBUMIN / CREATININE URINE RATIO
Creatinine,U: 108.3 mg/dL
Microalb Creat Ratio: 1.4 mg/g (ref 0.0–30.0)
Microalb, Ur: 1.5 mg/dL (ref 0.0–1.9)

## 2023-10-19 LAB — LIPID PANEL
Cholesterol: 118 mg/dL (ref 0–200)
HDL: 49.2 mg/dL (ref >39.00)
LDL Cholesterol: 52 mg/dL (ref 0–99)
NonHDL: 68.79
Total CHOL/HDL Ratio: 2
Triglycerides: 82 mg/dL (ref 0.0–149.0)
VLDL: 16.4 mg/dL (ref 0.0–40.0)

## 2023-10-19 LAB — HEMOGLOBIN A1C: Hgb A1c MFr Bld: 6.2 % (ref 4.6–6.5)

## 2023-10-19 NOTE — Assessment & Plan Note (Signed)
Chronic issue.  Adequately controlled.  Patient will continue carvedilol 25 mg twice daily, losartan 100 mg daily, and HCTZ 25 mg daily.

## 2023-10-19 NOTE — Progress Notes (Signed)
Marikay Alar, MD Phone: [phone]  Kathryn Massey is a 57 y.o. female who presents today for f/u.  DIABETES Disease Monitoring: Blood Sugar ranges-not checking Polyuria/phagia/dipsia- no      Optho- UTD Medications: Compliance- taking jardiance, metformin, ozempic Hypoglycemic symptoms- no  HYPERTENSION Disease Monitoring Home BP Monitoring <130/80 Chest pain- no    Dyspnea- no Medications Compliance-  taking hydrochlorothiazide, coreg, losartan.  Edema- no BMET    Component Value Date/Time   NA 140 03/28/2023 1538   K 4.8 03/28/2023 1538   CL 99 03/28/2023 1538   CO2 31 03/28/2023 1538   GLUCOSE 92 03/28/2023 1538   BUN 19 03/28/2023 1538   CREATININE 0.79 03/28/2023 1538   CALCIUM 10.1 03/28/2023 1538   GFRNONAA >60 05/26/2015 1147   GFRAA >60 05/26/2015 1147      Social History   Tobacco Use  Smoking Status Never  Smokeless Tobacco Never    Current Outpatient Medications on File Prior to Visit  Medication Sig Dispense Refill   atorvastatin (LIPITOR) 40 MG tablet TAKE 1 TABLET BY MOUTH EVERY DAY 90 tablet 3   Blood Glucose Monitoring Suppl (ONE TOUCH ULTRA SYSTEM KIT) W/DEVICE KIT 1 kit by Does not apply route once. 1 each 0   carvedilol (COREG) 25 MG tablet TAKE 1 TABLET (25 MG TOTAL) BY MOUTH TWICE A DAY WITH MEALS 180 tablet 1   fluconazole (DIFLUCAN) 150 MG tablet Take 1 tablet (150 mg total) by mouth daily. 1 tablet 0   glucose blood (ONE TOUCH ULTRA TEST) test strip Use as instructed 100 each 12   hydrochlorothiazide (HYDRODIURIL) 25 MG tablet TAKE 1 TABLET BY MOUTH EVERY DAY 90 tablet 2   JARDIANCE 25 MG TABS tablet TAKE 1 TABLET BY MOUTH EVERY DAY 90 tablet 2   losartan (COZAAR) 100 MG tablet TAKE 1 TABLET BY MOUTH EVERY DAY 90 tablet 2   metFORMIN (GLUCOPHAGE-XR) 500 MG 24 hr tablet TAKE 2 TABLETS (1,000 MG TOTAL) BY MOUTH 2 (TWO) TIMES DAILY WITH A MEAL. 360 tablet 2   Semaglutide,0.25 or 0.5MG /DOS, (OZEMPIC, 0.25 OR 0.5 MG/DOSE,) 2 MG/3ML  SOPN INJECT 0.5 MG INTO THE SKIN ONE TIME PER WEEK 3 mL 1   No current facility-administered medications on file prior to visit.     ROS see history of present illness  Objective  Physical Exam Vitals:   10/19/23 0956  BP: 114/76  Pulse: 75  Temp: 97.8 F (36.6 C)  SpO2: 94%    BP Readings from Last 3 Encounters:  10/19/23 114/76  03/28/23 122/78  03/22/23 132/80   Wt Readings from Last 3 Encounters:  10/19/23 132 lb (59.9 kg)  03/28/23 129 lb (58.5 kg)  03/22/23 131 lb 11.2 oz (59.7 kg)    Physical Exam Constitutional:      General: She is not in acute distress.    Appearance: She is not diaphoretic.  Cardiovascular:     Rate and Rhythm: Normal rate and regular rhythm.     Heart sounds: Normal heart sounds.  Pulmonary:     Effort: Pulmonary effort is normal.     Breath sounds: Normal breath sounds.  Skin:    General: Skin is warm and dry.  Neurological:     Mental Status: She is alert.    Diabetic Foot Exam - Simple   Simple Foot Form Diabetic Foot exam was performed with the following findings: Yes 10/19/2023 10:20 AM  Visual Inspection No deformities, no ulcerations, no other skin breakdown bilaterally: Yes Sensation  Testing Intact to touch and monofilament testing bilaterally: Yes Pulse Check Posterior Tibialis and Dorsalis pulse intact bilaterally: Yes Comments      Assessment/Plan: Please see individual problem list.  Primary hypertension Assessment & Plan: Chronic issue.  Adequately controlled.  Patient will continue carvedilol 25 mg twice daily, losartan 100 mg daily, and HCTZ 25 mg daily.  Orders: -     Comprehensive metabolic panel -     Lipid panel  Type 2 diabetes mellitus without complication, without long-term current use of insulin (HCC) Assessment & Plan: Chronic issue.  Check labs today.  Continue metformin 1000 mg twice daily, Ozempic 0.5 mg weekly, and Jardiance 25 mg daily.  Orders: -     Comprehensive metabolic panel -      Lipid panel -     Hemoglobin A1c -     Microalbumin / creatinine urine ratio     Return in about 6 months (around 04/17/2024) for physical with new provider.   Marikay Alar, MD [facility]

## 2023-10-19 NOTE — Assessment & Plan Note (Signed)
Chronic issue.  Check labs today.  Continue metformin 1000 mg twice daily, Ozempic 0.5 mg weekly, and Jardiance 25 mg daily.

## 2023-11-29 ENCOUNTER — Other Ambulatory Visit: Payer: Self-pay | Admitting: Family Medicine

## 2023-12-07 ENCOUNTER — Other Ambulatory Visit: Payer: Self-pay | Admitting: Family Medicine

## 2024-01-09 ENCOUNTER — Encounter: Payer: BC Managed Care – PPO | Admitting: Family Medicine

## 2024-01-22 ENCOUNTER — Other Ambulatory Visit: Payer: Self-pay | Admitting: Family Medicine

## 2024-02-22 ENCOUNTER — Other Ambulatory Visit: Payer: Self-pay

## 2024-02-22 MED ORDER — OZEMPIC (0.25 OR 0.5 MG/DOSE) 2 MG/3ML ~~LOC~~ SOPN: PEN_INJECTOR | SUBCUTANEOUS | 0 refills | Status: DC

## 2024-02-22 NOTE — Telephone Encounter (Signed)
Pt notified via phone call and mychart.

## 2024-02-22 NOTE — Telephone Encounter (Signed)
 Copied from CRM [id number]. Topic: Clinical - Prescription Issue >> Feb 22, 2024 10:17 AM Florestine Avers wrote: Reason for CRM: Patient needs a refill on her Semaglutide,0.25 or 0.5MG /DOS, (OZEMPIC, 0.25 OR 0.5 MG/DOSE,) 2 MG/3ML SOPN but her doctor is no  longer at the practice. She has a upcoming appointment with Dr. Judithann Sheen and is asking for a refill to hold her over until then. I was unable to send a refill request because I was unsure as to which doctor I should attached to the refill. Please advise.

## 2024-03-20 ENCOUNTER — Other Ambulatory Visit: Payer: Self-pay | Admitting: Internal Medicine

## 2024-03-31 ENCOUNTER — Other Ambulatory Visit: Payer: Self-pay | Admitting: Internal Medicine

## 2024-03-31 DIAGNOSIS — Z1231 Encounter for screening mammogram for malignant neoplasm of breast: Secondary | ICD-10-CM

## 2024-04-10 ENCOUNTER — Ambulatory Visit
Admission: RE | Admit: 2024-04-10 | Discharge: 2024-04-10 | Disposition: A | Discharge status: Home / Self Care | Payer: Self-pay | Source: Ambulatory Visit | Attending: Internal Medicine | Admitting: Internal Medicine

## 2024-04-10 DIAGNOSIS — Z1231 Encounter for screening mammogram for malignant neoplasm of breast: Secondary | ICD-10-CM | POA: Insufficient documentation

## 2024-04-18 ENCOUNTER — Encounter: Payer: BC Managed Care – PPO | Admitting: Family Medicine

## 2024-04-21 ENCOUNTER — Other Ambulatory Visit: Payer: Self-pay | Admitting: Internal Medicine

## 2024-05-16 ENCOUNTER — Other Ambulatory Visit: Payer: Self-pay | Admitting: Internal Medicine
